# Patient Record
Sex: Male | Born: 1945
Health system: Southern US, Community
[De-identification: ages and names within clinical notes are randomized; demographics above are authoritative.]

## PROBLEM LIST (undated history)

## (undated) DIAGNOSIS — R972 Elevated prostate specific antigen [PSA]: Secondary | ICD-10-CM

## (undated) DIAGNOSIS — M1990 Unspecified osteoarthritis, unspecified site: Secondary | ICD-10-CM

## (undated) DIAGNOSIS — R351 Nocturia: Secondary | ICD-10-CM

## (undated) DIAGNOSIS — Z973 Presence of spectacles and contact lenses: Secondary | ICD-10-CM

## (undated) DIAGNOSIS — C61 Malignant neoplasm of prostate: Secondary | ICD-10-CM

## (undated) DIAGNOSIS — N400 Enlarged prostate without lower urinary tract symptoms: Secondary | ICD-10-CM

## (undated) DIAGNOSIS — I10 Essential (primary) hypertension: Secondary | ICD-10-CM

## (undated) DIAGNOSIS — H409 Unspecified glaucoma: Secondary | ICD-10-CM

## (undated) DIAGNOSIS — R3912 Poor urinary stream: Secondary | ICD-10-CM

## (undated) DIAGNOSIS — E785 Hyperlipidemia, unspecified: Secondary | ICD-10-CM

## (undated) HISTORY — PX: CATARACT EXTRACTION: SUR2

## (undated) HISTORY — PX: BACK SURGERY: SHX140

## (undated) HISTORY — PX: PROSTATE BIOPSY: SHX241

---

## 2001-10-25 ENCOUNTER — Ambulatory Visit (HOSPITAL_COMMUNITY): Admission: RE | Admit: 2001-10-25 | Discharge: 2001-10-25 | Payer: Self-pay | Admitting: Family Medicine

## 2001-10-25 ENCOUNTER — Encounter: Payer: Self-pay | Admitting: Family Medicine

## 2001-11-05 ENCOUNTER — Inpatient Hospital Stay (HOSPITAL_COMMUNITY): Admission: RE | Admit: 2001-11-05 | Discharge: 2001-11-12 | Payer: Self-pay | Admitting: Neurosurgery

## 2001-11-05 ENCOUNTER — Encounter: Payer: Self-pay | Admitting: Neurosurgery

## 2001-11-08 ENCOUNTER — Encounter: Payer: Self-pay | Admitting: Neurosurgery

## 2001-11-10 ENCOUNTER — Encounter: Payer: Self-pay | Admitting: Neurosurgery

## 2001-11-12 ENCOUNTER — Inpatient Hospital Stay (HOSPITAL_COMMUNITY)
Admission: RE | Admit: 2001-11-12 | Discharge: 2001-11-18 | Payer: Self-pay | Admitting: Physical Medicine & Rehabilitation

## 2001-11-22 ENCOUNTER — Encounter
Admission: RE | Admit: 2001-11-22 | Discharge: 2002-01-13 | Payer: Self-pay | Admitting: Physical Medicine & Rehabilitation

## 2002-02-21 ENCOUNTER — Encounter: Payer: Self-pay | Admitting: General Surgery

## 2002-02-21 ENCOUNTER — Encounter: Admission: RE | Admit: 2002-02-21 | Discharge: 2002-02-21 | Payer: Self-pay | Admitting: General Surgery

## 2002-02-22 ENCOUNTER — Ambulatory Visit (HOSPITAL_BASED_OUTPATIENT_CLINIC_OR_DEPARTMENT_OTHER): Admission: RE | Admit: 2002-02-22 | Discharge: 2002-02-22 | Payer: Self-pay | Admitting: General Surgery

## 2002-03-29 ENCOUNTER — Encounter: Admission: RE | Admit: 2002-03-29 | Discharge: 2002-04-07 | Payer: Self-pay | Admitting: Neurosurgery

## 2002-10-10 HISTORY — PX: BACK SURGERY: SHX140

## 2003-08-24 ENCOUNTER — Ambulatory Visit (HOSPITAL_COMMUNITY): Admission: RE | Admit: 2003-08-24 | Discharge: 2003-08-24 | Payer: Self-pay | Admitting: Family Medicine

## 2003-08-24 ENCOUNTER — Encounter: Payer: Self-pay | Admitting: Family Medicine

## 2004-11-24 ENCOUNTER — Emergency Department (HOSPITAL_COMMUNITY): Admission: EM | Admit: 2004-11-24 | Discharge: 2004-11-24 | Payer: Self-pay | Admitting: Emergency Medicine

## 2005-01-18 ENCOUNTER — Emergency Department (HOSPITAL_COMMUNITY): Admission: EM | Admit: 2005-01-18 | Discharge: 2005-01-18 | Payer: Self-pay | Admitting: Emergency Medicine

## 2009-04-29 ENCOUNTER — Emergency Department (HOSPITAL_COMMUNITY): Admission: EM | Admit: 2009-04-29 | Discharge: 2009-04-29 | Payer: Self-pay | Admitting: Emergency Medicine

## 2010-05-21 ENCOUNTER — Ambulatory Visit (HOSPITAL_COMMUNITY): Admission: RE | Admit: 2010-05-21 | Discharge: 2010-05-21 | Payer: Self-pay | Admitting: Family Medicine

## 2011-03-28 NOTE — Discharge Summary (Signed)
[place]. [facility]  Patient:    RISHABH, RINKENBERGER Visit Number: [id number] MRN: [id number]          Service Type: [facility] Location: [id number] Attending Physician:  Herold Harms Dictated by:   Tanya Nones. Jeral Fruit, M.D. Admit Date:  11/12/2001 Disc. Date: 11/11/01                             Discharge Summary  ADMISSION DIAGNOSES: 1. Left L3-4 herniated disc with atrophy of the quadriceps. 2. Diabetes mellitus.  POSTOPERATIVE DIAGNOSES: 1. Left L3-4 herniated disc with atrophy of the quadriceps. 2. Diabetes mellitus. 3. Pneumonia.  HISTORY OF PRESENT ILLNESS:  The patient was seen by me in my office because of back and left leg pain.  Patient had been unable to walk for four weeks. When I saw him in my office, he had a large herniated disc at the level of L3-4 with compromise of the L3 nerve.  He also was noted to have lumbar stenosis.  He has history of diabetes mellitus.  PHYSICAL EXAMINATION:  EXTREMITIES:  2/5 weakness of the left iliac and left quadriceps.  There was mild weakness and pain.  LABORATORY AND ACCESSORY DATA:  At the present time, he has Pseudomonas in the urine.  White blood cells are 6.5 with hematocrit of 32.5 and hemoglobin of 11.4.  Glucose levels between 208 and 147.  Chest x-ray showed the possibility of fracture of the pubis.  However, this was completely ruled out.  Chest x-ray showed pneumonia in the left side.  HOSPITAL COURSE:  The patient was taken to surgery.  Left L3-4 diskectomy with removal of multiple fragments was accomplished.  After surgery, the patient was kept flat in bed.  He had developed fever.  He was seen by Dr. Sharon Seller.  Treatment was started for pneumonia.  The patient has been afebrile for the past three days.  He has no headache.  White blood cells are back to normal.  Lungs sound more clear.  Due to his weakness, decision was made to transfer him to rehabilitation center.  DISCHARGE  CONDITION:  Improved from the point of view he has no pain.  He continues to have weakness of the left leg.  DISCHARGE INSTRUCTIONS:  Continue ADA diet.  Activity as per rehabilitation.  DISCHARGE MEDICATIONS:  He will be taking the same medications while here. Dictated by:   Tanya Nones. Jeral Fruit, M.D. Attending Physician:  Herold Harms DD:  11/12/01 TD:  11/12/01 Job: [id number]

## 2011-03-28 NOTE — Op Note (Signed)
[place]. [facility]  Patient:    Alex Sanchez, Alex Sanchez Visit Number: [id number] MRN: [id number]          Service Type: DSU Location: [facility] Attending Physician:  Arlis Porta Dictated by:   Adolph Pollack, M.D. Proc. Date: 02/22/02 Admit Date:  02/22/2002   CC:         Tanya Nones. Jeral Fruit, M.D.  Butch Penny, M.D.   Operative Report  PREOPERATIVE DIAGNOSIS:  Bilateral inguinal hernias.  POSTOPERATIVE DIAGNOSIS:  Bilateral direct inguinal hernias.  OPERATION PERFORMED:  Laparoscopic repair of bilateral direct inguinal hernias with mesh.  SURGEON:  Adolph Pollack, M.D.  ANESTHESIA:  General.  INDICATIONS FOR PROCEDURE:  This 65 year old male has been noticing an enlarging bulge in the groin that is consistent with a hernia.  He also has a small hernia in the right inguinal area.  He now presents for elective repair. The procedure and the risks were explained to him.  DESCRIPTION OF PROCEDURE:  The patient was brought to the operating room and placed supine on the operating table and general anesthetic was administered. The lower abdomen and groin area were shaved.  A Foley catheter was placed in the bladder.  The lower abdomen and groin were then sterilely prepped and draped.  Local anesthetic was infiltrated in the subumbilical region and a transverse incision was made and carried down to the fascia.  The left anterior rectus sheath was identified and a small incision made in it.  The left rectus muscle was swept laterally exposing the posterior rectus sheath. A plane was created on the posterior rectus sheath into the extraperitoneal space.  A balloon dissection device was passed into the extraperitoneal space and under direct vision, balloon dissection performed of the extraperitoneal space and lower abdomen.  The balloon dissection device was then removed and a trocar was inserted into the extraperitoneal space and CO2 gas  insufflated creating a working space.  Next, the laparoscope was introduced.  Under direct vision two 5 mm trocars were placed through small incisions in the lower midline.  I began blunt dissection to identify the pubic tubercle and Coopers ligament bilaterally.  On the left side I noticed a direct hernia with extraperitoneal fat in it and I reduced this leaving the sac behind but reducing all the extraperitoneal fat back into the extraperitoneal space.  The inferior epigastric vessel was identified and then the spermatic cord contents were dissected free and isolated.  The peritoneum was stripped down on the cord to the level of the umbilicus.  I then dissected free the lateral anterior abdominal wall to the level of the umbilicus.  Next, the right side was approached.  Again, a direct hernia was reduced. This was smaller than on the left side.  I then identified the spermatic cord, isolated this.  A small tear was made in the peritoneum and this was closed with clips.  I stripped the peritoneum off the cord back to the level of the umbilicus then dissected free the anterior and lateral abdominal walls.  Next, a piece of 4 x 6 inch mesh with a slit cut in it longitudinally was passed into the left extraperitoneal space.  It was then anchored to the Coopers ligament with a tacking device.  The slit was placed such that the tails were wrapped around the cord creating a new internal ring.  I then anchored it laterally and anteriorly with the spiral tacker. This covered the direct space, indirect space, and femoral  spaces adequately and more than adequately covered the direct hernia.  Next, a piece of similar sized mesh cut in a similar fashion was placed in the right extraperitoneal space and anchored to Coopers ligament with the spiral tacker.  Once again I wrapped the tails of the mesh around the cord.  I then anchored the anterior and lateral aspects or the cord to the  anterior abdominal and lateral abdominal wall musculature with spiral tacks.  This also covered the direct space and the direct hernia defect adequately as well as covering the indirect and femoral spaces.  Next, I noted that hemostasis was adequate.  I held down the inferolateral aspects of both pieces of mesh.  I then released the CO2 gas.  I removed all the trocars and instruments.  I then closed the anterior rectus sheath fascial defect with interrupted 0 Vicryl sutures.  The skin incisions were closed with 4-0 Monocryl subcuticular stitches.  Steri-Strips and sterile dressings were applied.  The patient tolerated the procedure well without any apparent complications and was taken to recovery in satisfactory condition.  Discharge instruction sheet and pain medicine will be given to him. Dictated by:   Adolph Pollack, M.D. Attending Physician:  Arlis Porta DD:  02/22/02 TD:  02/22/02 Job: [id number]

## 2011-03-28 NOTE — H&P (Signed)
[place]. [facility]  Patient:    Alex Sanchez, Alex Sanchez Visit Number: [id number] MRN: [id number]          Service Type: SUR Location: 3000 [id number] Attending Physician:  Danella Penton Dictated by:   Tanya Nones. Jeral Fruit, M.D. Admit Date:  11/05/2001                           History and Physical  HISTORY OF PRESENT ILLNESS:  Mr. Eckrich is a gentleman who for several months has been complaining of back and left leg pain going to the knee but not below the knee.  The pain is getting worse, up to the point at the end of the month of December he only was able to walk one day.  He denies any problems with his right leg.  He denies any problems with ______ numbness which involved the anterior and medial aspect of the left thigh.  The patient had an MRI ______.  PAST MEDICAL HISTORY: 1. Blind many years ago.  He does not remember what type of surgery. 2. History of left inguinal hernia.  ALLERGIES:  He is not allergic to any medications.  SOCIAL HISTORY:  Negative.  FAMILY HISTORY:  Mother, 4, in good health.  Father, 62, with lung disease.  REVIEW OF SYSTEMS:  Positive for back pain, left leg weakness, high cholesterol, diarrhea, and difficulty urinating.  MEDICATIONS:  Glucophage, as well as an anti-inflammatory.  PHYSICAL EXAMINATION:  GENERAL:  The patient ______ .  He has quite a bit of difficulty sitting and standing.  HEENT:  Normal.  NECK:  Normal.  LUNGS:  Clear.  HEART:  Heart sounds normal.  No murmurs.  EXTREMITIES:  Normal pulses.  ABDOMEN:  There is a left inguinal hernia.  NEUROLOGIC:  Mental status normal.  Cranial nerves normal.  ______ right leg is normal.  Left leg, I can ______ easily the left iliopsoas and the left quadriceps is 2/5.  There is hypotonia of the quadriceps muscle.  Distally he is normal.  Sensation:  He complains of numbness in the anterior and medial aspect of the left thigh.  Reflexes symmetrical, with  absence of the left ______.  ______ maneuvers highly positive on the left side and negative on the right side.  LABORATORY DATA:  The MRI showed that, indeed, this gentleman has a large herniated disk at the level 3-4, with a fragment going to the ______.  CLINICAL IMPRESSION: 1. L3-L4 herniated disk. 2. Borderline lumber stenosis. 3. Inguinal hernia.  RECOMMENDATIONS:  I talked to him and his wife at length yesterday.  They want to go ahead with surgery.  The surgery was fully explained, as well as the risks, such as infection, damage to the vessel, need for further surgery if recurrence, CSF leak, and no improvement whatsoever. Dictated by:   Tanya Nones. Jeral Fruit, M.D. Attending Physician:  Danella Penton DD:  11/05/01 TD:  11/05/01 Job: [id number]

## 2011-03-28 NOTE — Op Note (Signed)
[place]. [facility]  Patient:    Alex Sanchez, Alex Sanchez Visit Number: [id number] MRN: [id number]          Service Type: Attending:  Tanya Nones. Jeral Fruit, M.D. Dictated by:   Tanya Nones. Jeral Fruit, M.D. Proc. Date: 11/05/01                             Operative Report  PREOPERATIVE DIAGNOSES: 1. Left L3-4 herniated disk with multiple fragments compromising the L3 nerve    root. 2. L5 weakness of the left iliopsoas and left quadriceps. 3. Diabetes.  POSTOPERATIVE DIAGNOSES: 1. Left L3-4 herniated disk with multiple fragments compromising the L3 nerve    root. 2. L5 weakness of the left iliopsoas and left quadriceps. 3. Diabetes.  PROCEDURE: 1. Left L3-4 diskectomy, removal of 23-25 free fragments compromising the L3    nerve root. 2. Total gross diskectomy. 3. Microscope.  SURGEON:  Tanya Nones. Jeral Fruit, M.D.  ASSISTANT:  Hewitt Shorts, M.D.  CLINICAL HISTORY:  [name] is a 65 year old gentleman complaining of back and left leg pain for many weeks.  When I saw him yesterday in my office, he had the iliopsoas and the quadriceps at 2/5.  He has absence of the left quadriceps reflex.  Femoral-tracheal maneuver was highly positive.  MRI showed a large herniated disk with fragment going into both L3, compromising the L3 nerve root.  Surgery was advised, and the risks were explained in the history and physical.  DESCRIPTION OF PROCEDURE:  The patient was taken to the OR, and he was positioned in a prone manner.  X-ray was taken which showed that we were at the level of the spinous process of L4.  Then a midline incision from L3 to L4 was made and muscle was retracted laterally.  Nevertheless, to be 100% sure, a repeat x-ray showed that indeed we were at the level of 3-4.  Then we brought the microscope into the area, and with the drill we drilled the lower lamina of L3 and the upper of L5.  Part of the medial facet was removed.  We found that he has a  really quite calcified yellow ligament, which was also excised using the Kerrison punch.  Finally we were able to see the thecal sac, and traction was done medially until we were able to find the L4.  The L4 nerve root was clean.  Immediately what we found, there were several multiple fragments going from the disk of 3-4, going up into the body of L3 and down into the foramen of the L3 nerve root.  Using the pituitary rongeur, between 23 and 25 multiple fragments were removed.  Some of them were right at the level of the axilla of the nerve root.  Then there was an opening in the disk space.  A total gross diskectomy was done.  Having done this, we investigated that there was any pouch of arachnoid at the level of the axilla of L3.  Two single stitches were applied.  Valsalva maneuver was negative.  Nevertheless, Tisseel was applied to the epidural space.  The area was irrigated.  Fat was left in the epidural space, and the wound was closed with Vicryl and nylon. Dictated by:   Tanya Nones. Jeral Fruit, M.D. Attending:  Tanya Nones. Jeral Fruit, M.D. DD:  11/05/01 TD:  11/06/01 Job: [id number]

## 2011-03-28 NOTE — Discharge Summary (Signed)
[place]. [facility]  Patient:    Alex Sanchez, Alex Sanchez Visit Number: [id number] MRN: [id number]          Service Type: [facility] Location: [id number] Attending Physician:  Herold Harms Dictated by:   Junie Bame, P.A. Admit Date:  11/12/2001 Discharge Date: 11/18/2001   CC:         Tanya Nones. Jeral Fruit, M.D.  Dr. Zenovia Jordan   Discharge Summary  DISCHARGE DIAGNOSES: 1. Status post lumbar discectomy secondary to L3-L4 herniated nucleus    pulposus/stenosis. 2. Diabetes mellitus. 3. Urinary tract infection.  HISTORY OF PRESENT ILLNESS:  Alex Sanchez is a 65 year old African American male with a past medical history of diabetes mellitus admitted on November 05, 2001, for lumbar discectomy secondary to L3-L4 herniated nucleus pulposus/stenosis by Tanya Nones. Jeral Fruit, M.D.  Postop complications include hypoxia, urinary tract infection and left-side weakness.  The patient is on subcutaneous heparin for DVT prophylaxis.  PT report at this time reports the patient is ambulating 190 feet with rolling walker and minimum assist for transfers sit to stand.  Hospital course is also significant for urinary tract infection, fevers and severe constipation.  Also noted the patient had a fall approximately two days ago (Wednesday) and is experiencing left-sided lower extremity weakness.  The patient was transferred to rehab on November 12, 2001.  PAST MEDICAL HISTORY:  Significant for diabetes mellitus.  PAST SURGICAL HISTORY:  None.  MEDICATIONS PRIOR TO ADMISSION: 1. Glucotrol 10 mg q.d. 2. Elavil 25 mg q.h.s. 3. Indocin 50 mg t.i.d.  ALLERGIES:  No known drug allergies.  PRIMARY CARE PHYSICIAN:  Dr. Zenovia Jordan.  SOCIAL HISTORY:  The patient lives in a one level home [place] independent prior to admission.  No alcohol or tobacco use. Employed at [employer].  Home has seven steps to entry. Wife is able to assist.  FAMILY HISTORY:   Noncontributory.  REVIEW OF SYSTEMS:  Denies any shortness of breath, chest pain and nausea and vomiting.  HOSPITAL COURSE:  Mr. [name] was admitted to [facility] on November 12, 2001, for comprehensive inpatient rehabilitation where he received more than three hours of PT and OT daily. His hospital course is significant for intermittent  and left leg numbness, urinary tract infection, and increased CBGs. The patient remained on subcutaneous heparin throughout his entire stay in rehab for DVT prophylaxis. Due to increase in CBGs, the patients glucotrol was increased from 5 mg p.o. q.d. to 3 mg p.o. b.i.d.  Glucophage also was added on November 15, 2001, at 500 mg p.o. q.d.  CBGs began to improve with the addition of these medications. At the time of admission the patient completed his course of Cipro 250 mg p.o. b.i.d. for urinary tract infection.  The patient remained asymptomatic while in rehab.  The patient complains of intermittent lower extremity numbness and pain.  He received Vicodin p.r.n. with relief.  The patient has also been followed by Dr. Jeral Fruit while in rehab.  Besides occasional constipation, there were no other major medical complications issues that occurred while in rehab. He received laxatives as needed.  Latest labs indicated that his white blood cell count was 3.9, hemoglobin 12.9, hematocrit 33.7, platelet count 338.  Sodium 136, potassium 4.3, glucose 153, BUN 12, creatinine 3.9.  AST 33, ALT 57, alkaline phosphatase 87.  Latest urine culture performed on November 13, 2001, came back Staphylococcal species of 20,000.  At the time of discharge,  stitches were removed.  Surgical incision demonstrated no signs of infection.  Surgical incision was well-healed.  The patient had minor decreased sensation in his left lower extremity.  The patient was discharged home with his family.  PT report at that time indicated the patient was  ambulating approximately 150 feet with rolling walker, modified independent.  He could perform all ADLs modified independently.  The patient was transferred home with his family.  Vital signs were stable at the time of transfer.  CBGs were running between 92, 167, 153, and 159 at the time of discharge.  DISCHARGE MEDICATIONS: 1. Elavil 25 mg p.o. q.h.s. 2. Glucotrol XL 10 mg twice daily. 3. Glucophage 500 mg daily. 4. Oxycodone 5 to 10 mg every four to six hours as needed for pain. 5. Tylenol 325 mg to 650 mg every four to six hours as needed for pain. 6. Multivitamins daily.  DISCHARGE INSTRUCTIONS:  The patient is to use walker, okay to shower, take back precautions.  No drinking.  No driving until follow-up with Dr. Jeral Fruit.  DIET RESTRICTIONS:  No concentrated sweets, low carbohydrates.  He is to check his sugars at least daily and write down results.  FOLLOW-UP: He will have outpatient therapy at [facility], [place], starting on November 22, 2001, at 9:30 and he was to follow up with Reuel Boom L. Thomasena Edis, M.D., as needed and Dr. Jeral Fruit in February.  Follow-up with Dr. Zenovia Jordan within four to six weeks. Dictated by:   Junie Bame, P.A. Attending Physician:  Herold Harms DD:  11/18/01 TD:  11/19/01 Job: [id number]

## 2011-08-12 ENCOUNTER — Emergency Department (HOSPITAL_COMMUNITY)
Admission: EM | Admit: 2011-08-12 | Discharge: 2011-08-12 | Disposition: A | Discharge status: Home / Self Care | Payer: Self-pay | Attending: Emergency Medicine | Admitting: Emergency Medicine

## 2011-08-12 DIAGNOSIS — E1169 Type 2 diabetes mellitus with other specified complication: Secondary | ICD-10-CM | POA: Insufficient documentation

## 2011-08-12 DIAGNOSIS — E7800 Pure hypercholesterolemia, unspecified: Secondary | ICD-10-CM | POA: Insufficient documentation

## 2011-08-12 DIAGNOSIS — Z760 Encounter for issue of repeat prescription: Secondary | ICD-10-CM | POA: Insufficient documentation

## 2011-08-12 LAB — GLUCOSE, CAPILLARY: Glucose-Capillary: 218 mg/dL — ABNORMAL HIGH (ref 70–99)

## 2011-11-17 ENCOUNTER — Emergency Department (HOSPITAL_COMMUNITY): Payer: Self-pay

## 2011-11-17 ENCOUNTER — Encounter: Payer: Self-pay | Admitting: Emergency Medicine

## 2011-11-17 ENCOUNTER — Emergency Department (HOSPITAL_COMMUNITY)
Admission: EM | Admit: 2011-11-17 | Discharge: 2011-11-17 | Disposition: A | Discharge status: Home / Self Care | Payer: Self-pay | Attending: Emergency Medicine | Admitting: Emergency Medicine

## 2011-11-17 DIAGNOSIS — J3489 Other specified disorders of nose and nasal sinuses: Secondary | ICD-10-CM | POA: Insufficient documentation

## 2011-11-17 DIAGNOSIS — R05 Cough: Secondary | ICD-10-CM | POA: Insufficient documentation

## 2011-11-17 DIAGNOSIS — I10 Essential (primary) hypertension: Secondary | ICD-10-CM | POA: Insufficient documentation

## 2011-11-17 DIAGNOSIS — R6889 Other general symptoms and signs: Secondary | ICD-10-CM | POA: Insufficient documentation

## 2011-11-17 DIAGNOSIS — E119 Type 2 diabetes mellitus without complications: Secondary | ICD-10-CM | POA: Insufficient documentation

## 2011-11-17 DIAGNOSIS — Z79899 Other long term (current) drug therapy: Secondary | ICD-10-CM | POA: Insufficient documentation

## 2011-11-17 DIAGNOSIS — R059 Cough, unspecified: Secondary | ICD-10-CM | POA: Insufficient documentation

## 2011-11-17 DIAGNOSIS — J069 Acute upper respiratory infection, unspecified: Secondary | ICD-10-CM | POA: Insufficient documentation

## 2011-11-17 HISTORY — DX: Essential (primary) hypertension: I10

## 2011-11-17 MED ORDER — HYDROCOD POLST-CHLORPHEN POLST 10-8 MG/5ML PO LQCR: 5.0000 mL | Freq: Two times a day (BID) | ORAL | Status: DC | PRN

## 2011-11-17 MED ORDER — ALBUTEROL SULFATE HFA 108 (90 BASE) MCG/ACT IN AERS
2.0000 | INHALATION_SPRAY | RESPIRATORY_TRACT | Status: DC
  Administered 2011-11-17: 2 via RESPIRATORY_TRACT
  Filled 2011-11-17: qty 6.7

## 2011-11-17 NOTE — ED Notes (Signed)
PT. REPORTS PERSISTENT DRY COUGH FOR 5 DAYS WITH NASAL CONGESTION AND SNEEZING.

## 2011-11-17 NOTE — ED Provider Notes (Signed)
History     CSN: [id number]  Arrival date & time 11/17/11  0620   First MD Initiated Contact with Patient 11/17/11 [phone]      Chief Complaint  Patient presents with  . Cough    (Consider location/radiation/quality/duration/timing/severity/associated sxs/prior treatment) HPI  66 year old gentleman presenting to the ED with chief complaints of cough. Patient states for the past 4-5 days he has been experiencing runny nose, nasal congestion, sneezing, and dry cough.  He denies fever, headache, rash, chest discomfort, nausea, vomiting, diarrhea, abdominal pain, back pain. He denies coughing up blood. He denies any change in his medication. He denies a smoking history. He presents to the ED concerned about the duration of his cough. He has not had his flu shot this year.  Past Medical History  Diagnosis Date  . Hypertension   . Diabetes mellitus     Past Surgical History  Procedure Date  . Back surgery     No family history on file.  History  Substance Use Topics  . Smoking status: Never Smoker   . Smokeless tobacco: Not on file  . Alcohol Use: No      Review of Systems  All other systems reviewed and are negative.    Allergies  Review of patient's allergies indicates no known allergies.  Home Medications   Current Outpatient Rx  Name Route Sig Dispense Refill  . ACETAMINOPHEN 500 MG PO TABS Oral Take 1,000 mg by mouth every 6 (six) hours as needed. For pain/fever     . DEXTROMETHORPHAN-GUAIFENESIN 10-100 MG/5ML PO LIQD Oral Take 15 mLs by mouth every 6 (six) hours as needed. For cough     . GLIPIZIDE ER 5 MG PO TB24 Oral Take 5 mg by mouth daily.      Marland Kitchen METFORMIN HCL 500 MG PO TABS Oral Take 500 mg by mouth 2 (two) times daily with a meal.        BP 130/63  Pulse 70  Temp(Src) 98.3 F (36.8 C) (Oral)  Resp 18  SpO2 98%  Physical Exam  Nursing note and vitals reviewed. Constitutional:       Awake, alert, nontoxic appearance  HENT:  Head: Atraumatic.    Eyes: Right eye exhibits no discharge. Left eye exhibits no discharge.  Neck: Neck supple.  Cardiovascular: Normal rate and regular rhythm.   Pulmonary/Chest: Effort normal. No respiratory distress. He has no wheezes. He has no rales. He exhibits no tenderness.  Abdominal: There is no tenderness. There is no rebound.  Musculoskeletal: He exhibits no tenderness.       Baseline ROM, no obvious new focal weakness  Neurological:       Mental status and motor strength appears baseline for patient and situation  Skin: No rash noted.  Psychiatric: He has a normal mood and affect.    ED Course  Procedures (including critical care time)  Labs Reviewed - No data to display No results found.   No diagnosis found.    MDM  Sxs suggestive of URI.  However, due to age and pt concern, a CXR has been ordered.  Pt currently in NAD, VSS, and afebrile.     7:49 AM CXR normal, reassurance given.  Will discharge with cough medicine and f/u instruction.      Fayrene Helper, [place] 11/17/11 [phone]

## 2011-11-17 NOTE — ED Notes (Signed)
Breath sounds are clear. Pt resting comfortably. Drinks offered to family and provided for patient. Waiting for x-ray results and disposition.

## 2011-11-17 NOTE — ED Provider Notes (Signed)
Medical screening examination/treatment/procedure(s) were performed by non-physician practitioner and as supervising physician I was immediately available for consultation/collaboration.   Lyanne Co, MD 11/17/11 2123

## 2011-11-17 NOTE — ED Notes (Signed)
Pt states that he has had a persistent, dry, non-productive cough for 5 days.  He states that every winter he has the same symptoms, but this year it has not been relieved by robitussin.  Denies hx of asthma, copd, chf.  No LE edema.  Lung sounds clear bil.

## 2013-03-27 ENCOUNTER — Telehealth (HOSPITAL_COMMUNITY): Payer: Self-pay | Admitting: Emergency Medicine

## 2013-03-27 ENCOUNTER — Encounter (HOSPITAL_COMMUNITY): Payer: Self-pay

## 2013-03-27 ENCOUNTER — Emergency Department (HOSPITAL_COMMUNITY)
Admission: EM | Admit: 2013-03-27 | Discharge: 2013-03-27 | Disposition: A | Discharge status: Home / Self Care | Payer: Medicare Other | Attending: Emergency Medicine | Admitting: Emergency Medicine

## 2013-03-27 ENCOUNTER — Emergency Department (HOSPITAL_COMMUNITY): Payer: Medicare Other

## 2013-03-27 DIAGNOSIS — R6889 Other general symptoms and signs: Secondary | ICD-10-CM | POA: Insufficient documentation

## 2013-03-27 DIAGNOSIS — R05 Cough: Secondary | ICD-10-CM | POA: Insufficient documentation

## 2013-03-27 DIAGNOSIS — Z79899 Other long term (current) drug therapy: Secondary | ICD-10-CM | POA: Insufficient documentation

## 2013-03-27 DIAGNOSIS — I10 Essential (primary) hypertension: Secondary | ICD-10-CM | POA: Insufficient documentation

## 2013-03-27 DIAGNOSIS — J069 Acute upper respiratory infection, unspecified: Secondary | ICD-10-CM | POA: Insufficient documentation

## 2013-03-27 DIAGNOSIS — E119 Type 2 diabetes mellitus without complications: Secondary | ICD-10-CM | POA: Insufficient documentation

## 2013-03-27 DIAGNOSIS — R059 Cough, unspecified: Secondary | ICD-10-CM | POA: Insufficient documentation

## 2013-03-27 MED ORDER — MOMETASONE FUROATE 50 MCG/ACT NA SUSP: 2.0000 | Freq: Every day | NASAL | Status: DC

## 2013-03-27 MED ORDER — HYDROCODONE-HOMATROPINE 5-1.5 MG/5ML PO SYRP: 5.0000 mL | ORAL_SOLUTION | Freq: Four times a day (QID) | ORAL | Status: DC | PRN

## 2013-03-27 NOTE — ED Notes (Signed)
Patient discharged with instructions and using teach back method and he verbalizes an understanding

## 2013-03-27 NOTE — ED Provider Notes (Signed)
History     CSN: [id number]  Arrival date & time 03/27/13  7829   First MD Initiated Contact with Patient 03/27/13 [phone]      Chief Complaint  Patient presents with  . Nasal Congestion    (Consider location/radiation/quality/duration/timing/severity/associated sxs/prior treatment) HPI  Patient presents with concerns of cough, congestion, sneezing. Symptoms began approximately 5 days ago. Since onset symptoms have been persistent, with no relief with OTC medication. The patient specifically denies fever, chills, vomiting, diarrhea, chest pain, belly pain. No clear exacerbating factors.   Past Medical History  Diagnosis Date  . Hypertension   . Diabetes mellitus     Past Surgical History  Procedure Laterality Date  . Back surgery      No family history on file.  History  Substance Use Topics  . Smoking status: Never Smoker   . Smokeless tobacco: Not on file  . Alcohol Use: No      Review of Systems  Constitutional:       Per HPI, otherwise negative  HENT:       Per HPI, otherwise negative  Respiratory:       Per HPI, otherwise negative  Cardiovascular:       Per HPI, otherwise negative  Gastrointestinal: Negative for vomiting.  Endocrine:       Negative aside from HPI  Genitourinary:       Neg aside from HPI   Musculoskeletal:       Per HPI, otherwise negative  Skin: Negative.   Neurological: Negative for syncope.    Allergies  Review of patient's allergies indicates no known allergies.  Home Medications   Current Outpatient Rx  Name  Route  Sig  Dispense  Refill  . chlorpheniramine-HYDROcodone (TUSSIONEX PENNKINETIC ER) 10-8 MG/5ML LQCR   Oral   Take 5 mLs by mouth every 12 (twelve) hours as needed.   115 mL   0   . glipiZIDE (GLUCOTROL XL) 5 MG 24 hr tablet   Oral   Take 5 mg by mouth daily.           . metFORMIN (GLUCOPHAGE) 500 MG tablet   Oral   Take 500 mg by mouth 2 (two) times daily with a meal.             BP 151/84   Pulse 85  Temp(Src) 97.8 F (36.6 C)  Resp 20  SpO2 97%  Physical Exam  Nursing note and vitals reviewed. Constitutional: He is oriented to person, place, and time. He appears well-developed. No distress.  HENT:  Head: Normocephalic and atraumatic.  Eyes: Conjunctivae and EOM are normal.  Cardiovascular: Normal rate and regular rhythm.   Pulmonary/Chest: Effort normal. No stridor. No respiratory distress.  Abdominal: He exhibits no distension. There is no tenderness.  Musculoskeletal: He exhibits no edema.  Neurological: He is alert and oriented to person, place, and time.  Skin: Skin is warm and dry.  Psychiatric: He has a normal mood and affect.    ED Course  Procedures (including critical care time)  Labs Reviewed  GLUCOSE, CAPILLARY - Abnormal; Notable for the following:    Glucose-Capillary 238 (*)    All other components within normal limits   No results found.   No diagnosis found.   Pulse ox 99% room air normal  MDM  This patient presents with days of cough, congestion.  On exam is awake alert, not hypoxic, tachypneic or febrile. Patient's x-ray does not demonstrate pneumonia, and absent any other significant complaints,  the patient was discharged with new medication for cough, congestion. Absent any distress, significant risk factors, there is low suspicion for occult ACS or other significant acute pathology.  Gerhard Munch, MD 03/27/13 [phone]

## 2013-03-27 NOTE — ED Notes (Signed)
Pt. Having nasal congestion, sneezing, coughing for 4-5 days.  Denies any fevers

## 2013-03-27 NOTE — ED Notes (Signed)
Pharmacy call Nasonex too expensive can it be changed to Flonase?  Dr Jeraldine Loots consulted OK to switch to [facility].  Pharmacy informed.

## 2013-05-25 ENCOUNTER — Ambulatory Visit (HOSPITAL_COMMUNITY)
Admission: RE | Admit: 2013-05-25 | Discharge: 2013-05-25 | Disposition: A | Discharge status: Home / Self Care | Payer: Medicare Other | Source: Ambulatory Visit | Attending: Family Medicine | Admitting: Family Medicine

## 2013-05-25 ENCOUNTER — Other Ambulatory Visit (HOSPITAL_COMMUNITY): Payer: Self-pay | Admitting: Family Medicine

## 2013-05-25 DIAGNOSIS — M25512 Pain in left shoulder: Secondary | ICD-10-CM

## 2013-05-25 DIAGNOSIS — M25519 Pain in unspecified shoulder: Secondary | ICD-10-CM | POA: Insufficient documentation

## 2013-09-22 ENCOUNTER — Other Ambulatory Visit (HOSPITAL_COMMUNITY): Payer: Self-pay | Admitting: Family Medicine

## 2013-09-22 ENCOUNTER — Other Ambulatory Visit (HOSPITAL_COMMUNITY): Payer: Medicare Other

## 2013-09-22 DIAGNOSIS — R109 Unspecified abdominal pain: Secondary | ICD-10-CM

## 2013-09-22 DIAGNOSIS — R1011 Right upper quadrant pain: Secondary | ICD-10-CM

## 2013-09-23 ENCOUNTER — Ambulatory Visit (HOSPITAL_COMMUNITY): Payer: Medicare Other | Attending: Family Medicine

## 2013-09-29 ENCOUNTER — Encounter (HOSPITAL_COMMUNITY): Payer: Self-pay

## 2013-09-29 ENCOUNTER — Ambulatory Visit (HOSPITAL_COMMUNITY)
Admission: RE | Admit: 2013-09-29 | Discharge: 2013-09-29 | Disposition: A | Discharge status: Home / Self Care | Payer: Medicare Other | Source: Ambulatory Visit | Attending: Family Medicine | Admitting: Family Medicine

## 2013-09-29 DIAGNOSIS — R1031 Right lower quadrant pain: Secondary | ICD-10-CM | POA: Insufficient documentation

## 2013-09-29 DIAGNOSIS — N400 Enlarged prostate without lower urinary tract symptoms: Secondary | ICD-10-CM | POA: Insufficient documentation

## 2013-09-29 DIAGNOSIS — R109 Unspecified abdominal pain: Secondary | ICD-10-CM

## 2013-09-29 DIAGNOSIS — R1011 Right upper quadrant pain: Secondary | ICD-10-CM | POA: Insufficient documentation

## 2013-09-29 MED ORDER — SODIUM CHLORIDE 0.9 % IJ SOLN
INTRAMUSCULAR | Status: AC
  Filled 2013-09-29: qty 500

## 2013-09-29 MED ORDER — IOHEXOL 300 MG/ML  SOLN
100.0000 mL | Freq: Once | INTRAMUSCULAR | Status: AC | PRN
  Administered 2013-09-29: 100 mL via INTRAVENOUS

## 2014-01-12 ENCOUNTER — Emergency Department (HOSPITAL_COMMUNITY)
Admission: EM | Admit: 2014-01-12 | Discharge: 2014-01-12 | Disposition: A | Discharge status: Home / Self Care | Payer: Medicare HMO | Attending: Emergency Medicine | Admitting: Emergency Medicine

## 2014-01-12 ENCOUNTER — Encounter (HOSPITAL_COMMUNITY): Payer: Self-pay | Admitting: Emergency Medicine

## 2014-01-12 ENCOUNTER — Emergency Department (HOSPITAL_COMMUNITY): Payer: Medicare HMO

## 2014-01-12 DIAGNOSIS — K6289 Other specified diseases of anus and rectum: Secondary | ICD-10-CM | POA: Insufficient documentation

## 2014-01-12 DIAGNOSIS — Z79899 Other long term (current) drug therapy: Secondary | ICD-10-CM | POA: Diagnosis not present

## 2014-01-12 DIAGNOSIS — Z7982 Long term (current) use of aspirin: Secondary | ICD-10-CM | POA: Insufficient documentation

## 2014-01-12 DIAGNOSIS — R339 Retention of urine, unspecified: Secondary | ICD-10-CM

## 2014-01-12 DIAGNOSIS — E119 Type 2 diabetes mellitus without complications: Secondary | ICD-10-CM | POA: Diagnosis not present

## 2014-01-12 DIAGNOSIS — R109 Unspecified abdominal pain: Secondary | ICD-10-CM | POA: Insufficient documentation

## 2014-01-12 DIAGNOSIS — K5900 Constipation, unspecified: Secondary | ICD-10-CM | POA: Diagnosis not present

## 2014-01-12 DIAGNOSIS — I10 Essential (primary) hypertension: Secondary | ICD-10-CM | POA: Insufficient documentation

## 2014-01-12 MED ORDER — FLEET ENEMA 7-19 GM/118ML RE ENEM
1.0000 | ENEMA | Freq: Once | RECTAL | Status: AC
  Administered 2014-01-12: 1 via RECTAL
  Filled 2014-01-12: qty 1

## 2014-01-12 MED ORDER — POLYETHYLENE GLYCOL 3350 17 GM/SCOOP PO POWD: 1.0000 | Freq: Once | ORAL | Status: DC

## 2014-01-12 NOTE — ED Notes (Signed)
No relief from enema.

## 2014-01-12 NOTE — Discharge Instructions (Signed)

## 2014-01-12 NOTE — ED Notes (Signed)
Pt states that he was able to urinate some but not relieve himself fully. Still no BM. Reports problems with prostate as well.

## 2014-01-12 NOTE — ED Notes (Signed)
Pt stated that he hadn't been able to urinate in the last 8hrs when offered the bathroom.

## 2014-01-12 NOTE — ED Provider Notes (Signed)
CSN: [id number]     Arrival date & time 01/12/14  1711 History   First MD Initiated Contact with Patient 01/12/14 1957     Chief Complaint  Patient presents with  . Constipation  . Urinary Retention   HPI Comments: 68 yo F hx of DMII, HTN, presents with CC of constipation, urinary retention.  Pt states he has been constipated for the last 3 weeks, with low volume, hard stools, with last BM yesterday.  He states this has worsened over the last few days, and today started to develop urinary retention 10 hours prior to arrival.  He c/o of suprapubic fullness, discomfort, and rectal fullness/pain.  Denies fevers, chills, CP, SOB, nausea, vomiting, rash, myalgias or any other symptoms.  Pt is DMII, HTN.  Has attempted to stay hydrated, but states he recently changed to bottled water, and believes he is getting less hydration as he normally needs.  He attempted taking 3 OTC laxatives No other complaints.    The history is provided by the patient. No language interpreter was used.    Past Medical History  Diagnosis Date  . Hypertension   . Diabetes mellitus    Past Surgical History  Procedure Laterality Date  . Back surgery     History reviewed. No pertinent family history. History  Substance Use Topics  . Smoking status: Never Smoker   . Smokeless tobacco: Not on file  . Alcohol Use: No    Review of Systems  Constitutional: Negative for fever and chills.  Respiratory: Negative for cough and shortness of breath.   Cardiovascular: Negative for chest pain, palpitations and leg swelling.  Gastrointestinal: Positive for abdominal pain, constipation and rectal pain. Negative for nausea, vomiting and diarrhea.  Musculoskeletal: Negative for myalgias.  Skin: Negative for rash.  Neurological: Negative for dizziness, weakness, light-headedness, numbness and headaches.  Hematological: Negative for adenopathy. Does not bruise/bleed easily.  All other systems reviewed and are  negative.      Allergies  Review of patient's allergies indicates no known allergies.  Home Medications   Current Outpatient Rx  Name  Route  Sig  Dispense  Refill  . aspirin 81 MG chewable tablet   Oral   Chew 81 mg by mouth daily.         Marland Kitchen. DM-APAP-CPM (CORICIDIN HBP FLU PO)   Oral   Take 2 tablets by mouth every 8 (eight) hours as needed (for cold symptoms).         Marland Kitchen. glipiZIDE (GLUCOTROL XL) 5 MG 24 hr tablet   Oral   Take 5 mg by mouth daily.           . metFORMIN (GLUCOPHAGE) 500 MG tablet   Oral   Take 500 mg by mouth 2 (two) times daily with a meal.           . Multiple Vitamins-Minerals (MULTIVITAMIN WITH MINERALS) tablet   Oral   Take 1 tablet by mouth daily.         Marland Kitchen. EXPIRED: mometasone (NASONEX) 50 MCG/ACT nasal spray   Nasal   Place 2 sprays into the nose daily.   17 g   0    BP 145/83  Pulse 113  Temp(Src) 97.9 F (36.6 C) (Oral)  Resp 18  Ht 6' (1.829 m)  Wt 170 lb (77.111 kg)  BMI 23.05 kg/m2  SpO2 98% Physical Exam  Vitals reviewed. Constitutional: He is oriented to person, place, and time. He appears well-developed and well-nourished.  HENT:  Head: Normocephalic and atraumatic.  Right Ear: External ear normal.  Left Ear: External ear normal.  Nose: Nose normal.  Mouth/Throat: Oropharynx is clear and moist.  Eyes: Conjunctivae and EOM are normal. Pupils are equal, round, and reactive to light.  Neck: Normal range of motion. Neck supple.  Cardiovascular: Normal rate, regular rhythm, normal heart sounds and intact distal pulses.   Pulmonary/Chest: Effort normal and breath sounds normal. No respiratory distress. He has no wheezes. He has no rales. He exhibits no tenderness.  Abdominal: Bowel sounds are normal. He exhibits no distension and no mass. There is tenderness. There is no rebound and no guarding.  Suprapubic fullness, with TTP.  Rest of abdomen is soft, nondistended, no TTP.    Genitourinary:  Impacted stool in rectal  vault.    Musculoskeletal: Normal range of motion.  Neurological: He is alert and oriented to person, place, and time.  Skin: Skin is warm and dry.    ED Course  Procedures (including critical care time) Labs Review Labs Reviewed - No data to display Imaging Review Dg Abd 1 View  01/12/2014   CLINICAL DATA:  Constipation.  Urinary retention.  EXAM: ABDOMEN - 1 VIEW  COMPARISON:  09/29/2013 abdominal CT  FINDINGS: Moderate volume of formed stool, present on multiple colonic segments. There is a prominent volume of fecal stool, distending the rectum to 9 cm transverse dimension. No evidence of small bowel obstruction.  No abnormal intra-abdominal mass effect, calcification. Low abdominal wall hernia repair. No acute osseous findings.  IMPRESSION: Prominent rectal stool.  No bowel obstruction.   Electronically Signed   By: Tiburcio Pea M.D.   On: 01/12/2014 22:44     EKG Interpretation None      MDM   Final diagnoses:  None   68 yo F hx of DMII, HTN, presents with CC of constipation, urinary retention.  Filed Vitals:   01/12/14 1903  BP: 145/83  Pulse: 113  Temp: 97.9 F (36.6 C)  Resp: 18   Physical exam as above.  Pt slightly tachy, likely 2/2 pain, otherwise VS WNL.  Pt with suprapubic fullness, discomfort, no other abdominal pain, and abdomen is soft, nondistended, nonperitonitic.  Pt with hard stool burden in rectal vault, which was disimpacted.  Pt was unable to defecate or urinate after this.  KUB showed no signs of obstruction, but did show large rectal stool burden.  Fleet enema given.  Shortly after this pt had significant bowel movement, and was finally able to urinate.  He states on reexam that he feels significantly improved.  Pt to be d/c home in good condition.  Encouraged to continue supportive care, high fiber diet, Miralax, and enema if needed.  F/u with PCP in 1 week.  Return precautions given.  Pt understands and agrees with plan.  I have discussed pt's care  plan with Dr. Preston Fleeting.  Jon Gills, MD      Jon Gills, MD 01/12/14 [phone]

## 2014-01-12 NOTE — ED Notes (Signed)
Per pt sts a few weeks of hard stool and now he is having urinary retention. sts some pressure in his bottom.

## 2014-01-12 NOTE — ED Notes (Signed)
MD at bedside- manually disimpacting pt.

## 2014-01-12 NOTE — ED Provider Notes (Signed)
68 year old male has been constipated for the last 3 weeks. Today, he has had difficulty passing urine. On exam, bladder is distended nearly to the umbilicus. He was noted to have a hard impaction on rectal exam. He will be given an enema. He is resisting Foley catheter placement at this time, so we will be observed to see if he is able to urinate after the enema.  I saw and evaluated the patient, reviewed the resident's note and I agree with the findings and plan.   Dione Boozeavid Evona Westra, MD 01/12/14 2111

## 2014-01-30 ENCOUNTER — Encounter (HOSPITAL_COMMUNITY): Payer: Self-pay | Admitting: Emergency Medicine

## 2014-01-30 ENCOUNTER — Emergency Department (HOSPITAL_COMMUNITY): Payer: Medicare HMO

## 2014-01-30 ENCOUNTER — Emergency Department (HOSPITAL_COMMUNITY)
Admission: EM | Admit: 2014-01-30 | Discharge: 2014-01-30 | Disposition: A | Discharge status: Home / Self Care | Payer: Medicare HMO | Attending: Emergency Medicine | Admitting: Emergency Medicine

## 2014-01-30 DIAGNOSIS — Z7982 Long term (current) use of aspirin: Secondary | ICD-10-CM | POA: Insufficient documentation

## 2014-01-30 DIAGNOSIS — I10 Essential (primary) hypertension: Secondary | ICD-10-CM | POA: Insufficient documentation

## 2014-01-30 DIAGNOSIS — Z79899 Other long term (current) drug therapy: Secondary | ICD-10-CM | POA: Insufficient documentation

## 2014-01-30 DIAGNOSIS — M25519 Pain in unspecified shoulder: Secondary | ICD-10-CM | POA: Insufficient documentation

## 2014-01-30 DIAGNOSIS — M779 Enthesopathy, unspecified: Secondary | ICD-10-CM

## 2014-01-30 DIAGNOSIS — E119 Type 2 diabetes mellitus without complications: Secondary | ICD-10-CM | POA: Insufficient documentation

## 2014-01-30 DIAGNOSIS — M948X9 Other specified disorders of cartilage, unspecified sites: Secondary | ICD-10-CM | POA: Insufficient documentation

## 2014-01-30 NOTE — Discharge Instructions (Signed)
Your xray results today reveal a bone spur of the right clavicle (collar bone).  There are no other worrisome findings.  Bone spurs are often caused by arthritis.  Please follow-up with your primary care provider.

## 2014-01-30 NOTE — ED Provider Notes (Signed)
CSN: [id number]     Arrival date & time 01/30/14  1450 History  This chart was scribed for non-physician practitioner Felicie Mornavid Kealy Lewter, NP working with Glynn OctaveStephen Rancour, MD by Valera CastleSteven Perry, ED scribe. This patient was seen in room TR07C/TR07C and the patient's care was started at 4:45 PM.   Chief Complaint  Patient presents with  . Neck Pain   (Consider location/radiation/quality/duration/timing/severity/associated sxs/prior Treatment) The history is provided by the patient. No language interpreter was used.   HPI Comments: Alex Sanchez is a 68 y.o. male who presents to the Emergency Department complaining of a nodal over his left clavicle, that has been gradually increasing, onset 6 months ago. He denies the area being painful to palpate. He denies cough, fever, weight loss, and any other associated symptoms. He reports taking Metformin for his DM, and takes cholesterol medication. He denies problems with his kidneys. He reports h/o pre-HTN.   PCP - Alice ReichertMCINNIS,ANGUS G, MD  Past Medical History  Diagnosis Date  . Hypertension   . Diabetes mellitus    Past Surgical History  Procedure Laterality Date  . Back surgery     No family history on file. History  Substance Use Topics  . Smoking status: Never Smoker   . Smokeless tobacco: Not on file  . Alcohol Use: No    Review of Systems  Constitutional: Negative for fever and unexpected weight change.  Respiratory: Negative for cough.   Musculoskeletal:       Non painful nodal over left clavicle  Skin: Negative for wound.  All other systems reviewed and are negative.   Allergies  Review of patient's allergies indicates no known allergies.  Home Medications   Current Outpatient Rx  Name  Route  Sig  Dispense  Refill  . aspirin 81 MG chewable tablet   Oral   Chew 81 mg by mouth daily.         Marland Kitchen. glipiZIDE (GLUCOTROL XL) 5 MG 24 hr tablet   Oral   Take 5 mg by mouth daily.           . metFORMIN (GLUCOPHAGE) 500 MG tablet    Oral   Take 500-1,000 mg by mouth 2 (two) times daily with a meal. Take 2 tablets in AM and 1 tablet in PM         . Multiple Vitamins-Minerals (MULTIVITAMIN WITH MINERALS) tablet   Oral   Take 1 tablet by mouth daily.         . simvastatin (ZOCOR) 40 MG tablet   Oral   Take 40 mg by mouth daily.         Marland Kitchen. EXPIRED: mometasone (NASONEX) 50 MCG/ACT nasal spray   Nasal   Place 2 sprays into the nose daily.   17 g   0    BP 132/70  Pulse 88  Temp(Src) 97.3 F (36.3 C)  Resp 16  SpO2 97%  Physical Exam  Nursing note and vitals reviewed. Constitutional: He is oriented to person, place, and time. He appears well-developed and well-nourished. No distress.  HENT:  Head: Normocephalic and atraumatic.  Eyes: EOM are normal.  Neck: Neck supple. No tracheal deviation present.  Cardiovascular: Normal rate.   Pulmonary/Chest: Effort normal. No respiratory distress.  Musculoskeletal: Normal range of motion. He exhibits no tenderness.  Nodal over sternal clavicle junction point. No pain, no injury.   Neurological: He is alert and oriented to person, place, and time.  Skin: Skin is warm and dry.  Psychiatric: He  has a normal mood and affect. His behavior is normal.    ED Course  Procedures (including critical care time)  DIAGNOSTIC STUDIES: Oxygen Saturation is 97% on room air, normal by my interpretation.    COORDINATION OF CARE: 4:48 PM-Discussed treatment plan which includes xrays with pt at bedside and pt agreed to plan.   Dg Chest 2 View  01/30/2014   CLINICAL DATA:  Clavicle and rib nodules  EXAM: CHEST  2 VIEW  COMPARISON:  07/28/2013  FINDINGS: Cardiomediastinal silhouette is stable. No acute infiltrate or pulmonary edema. Mild degenerative changes thoracic spine. There are skin markers on the right anterior clavicle and mid anterior ribs. No definite destructive bony lesions noted in the region of the markers. Clinical correlation is necessary. If there is high  clinical suspicious for destructive bony lesion further correlation with CT scan or MRI is recommended.  IMPRESSION: No active disease. No definite destructive bony lesion is noted. If there is high clinical suspicious for bony lesions further correlation with MRI or CT scan is recommended.   Electronically Signed   By: Natasha Mead M.D.   On: 01/30/2014 18:12   Ct Chest Wo Contrast  01/30/2014   CLINICAL DATA:  Enlarging lymph node along the clavicle.  EXAM: CT CHEST WITHOUT CONTRAST  TECHNIQUE: Multidetector CT imaging of the chest was performed following the standard protocol without IV contrast.  COMPARISON:  DG CHEST 2 VIEW dated 01/30/2014  FINDINGS: Technologist note indicates that the problem is over the left clavicle, but the marker indicating the location of the problem is near the right clavicular head. There is spurring of the right clavicular head. Adjacent strap musculature and thyroid gland appear normal. No other significant thoracic inlet abnormality observed. No visualize supraclavicular adenopathy.  No thoracic adenopathy. Prominence of gas and stool in the colon. Contracted gallbladder. Fatty infiltration along the gallbladder fossa. Bifida right third rib with the gracile left fourth rib which indents the left thorax. No associated thoracic vertebral anomalies although there is thoracic spondylosis.  The lungs appear clear.  IMPRESSION: 1. The marked "Nodule" appears to correspond to spurring of the medial right clavicular head. 2. 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 3. Prominence of gas and stool in the upper colon.   Electronically Signed   By: Herbie Baltimore M.D.   On: 01/30/2014 20:08    EKG Interpretation None     Medications - No data to display  Patient discussed with and seen by Dr. Manus Gunning.  Non-contrasted CT of chest obtained.  Results reviewed and  shared with patient. MDM   Final diagnoses:  None    Spurring of clavicular head.  Patient to follow-up with his PCP.  I personally performed the services described in this documentation, which was scribed in my presence. The recorded information has been reviewed and is accurate.    Jimmye Norman, NP 01/31/14 [phone]

## 2014-01-30 NOTE — ED Notes (Signed)
Nodal  on left clavicle that has been there 6 months seems to be getting bigger  States getting uncomfortable no pain mo injury

## 2014-01-30 NOTE — ED Notes (Signed)
Smith, NP at bedside.  

## 2014-01-31 NOTE — ED Provider Notes (Signed)
Medical screening examination/treatment/procedure(s) were conducted as a shared visit with non-physician practitioner(s) and myself.  I personally evaluated the patient during the encounter.  TTP R clavicle at medial head, no overlying skin change.  Lungs clear, heart regular   EKG Interpretation None       Glynn OctaveStephen Elfrida Pixley, MD 01/31/14 1020

## 2015-07-29 IMAGING — CR DG ABDOMEN 1V
1 series · 1 of 1 positions shown · non-contrast
Comparison: 09/29/2013 abdominal CT

CLINICAL DATA: Constipation.  Urinary retention.

EXAM:
ABDOMEN - 1 VIEW

[t abdomen supine]
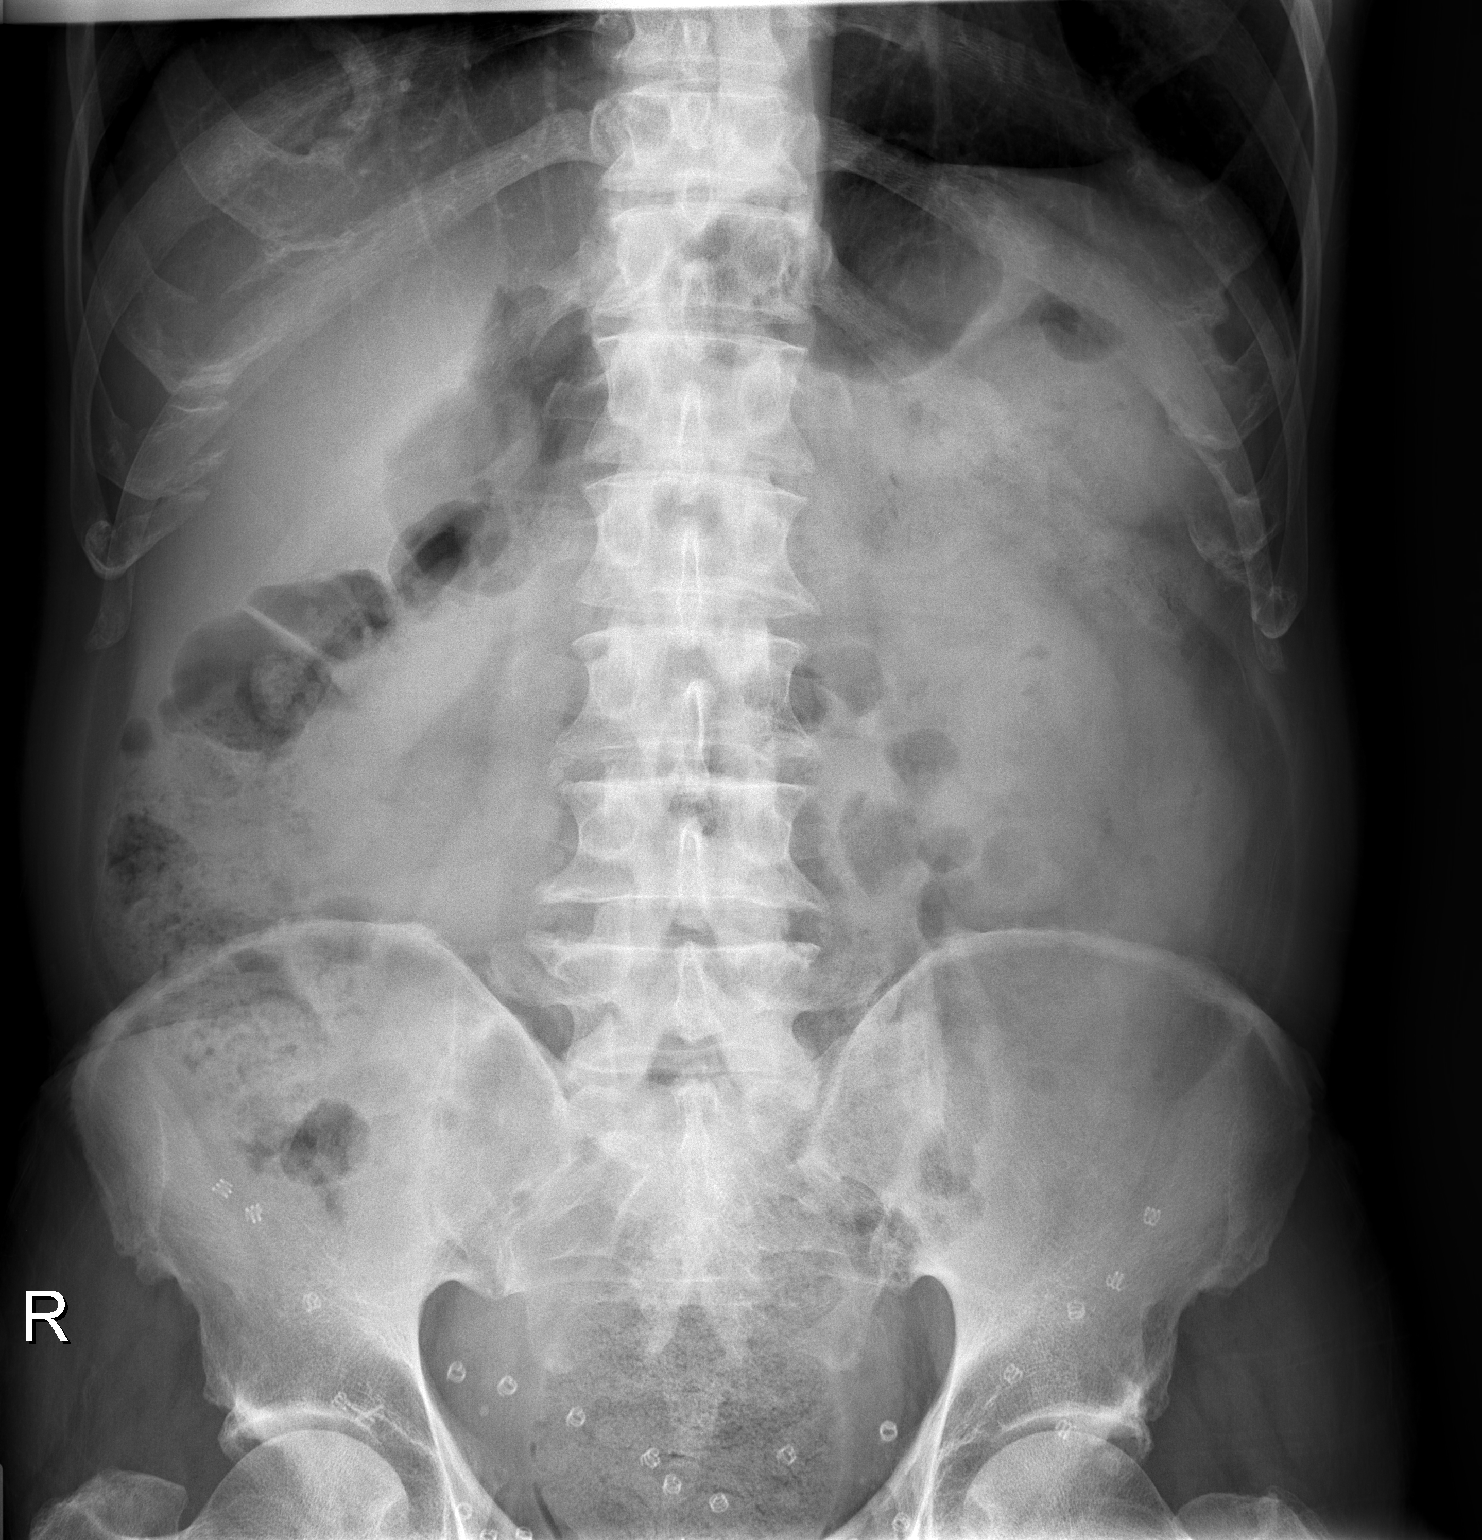

[1 of 1 positions shown; findings below may reference images not displayed]

FINDINGS: Moderate volume of formed stool, present on multiple colonic
segments. There is a prominent volume of fecal stool, distending the
rectum to 9 cm transverse dimension. No evidence of small bowel
obstruction.

No abnormal intra-abdominal mass effect, calcification. Low
abdominal wall hernia repair. No acute osseous findings.
IMPRESSION: Prominent rectal stool.  No bowel obstruction.

## 2015-08-06 ENCOUNTER — Encounter (INDEPENDENT_AMBULATORY_CARE_PROVIDER_SITE_OTHER): Payer: Self-pay | Admitting: *Deleted

## 2017-02-10 DIAGNOSIS — I10 Essential (primary) hypertension: Secondary | ICD-10-CM | POA: Diagnosis not present

## 2017-02-10 DIAGNOSIS — M545 Low back pain: Secondary | ICD-10-CM | POA: Diagnosis not present

## 2017-02-10 DIAGNOSIS — E119 Type 2 diabetes mellitus without complications: Secondary | ICD-10-CM | POA: Diagnosis not present

## 2017-02-10 DIAGNOSIS — Z7982 Long term (current) use of aspirin: Secondary | ICD-10-CM | POA: Diagnosis not present

## 2017-02-10 DIAGNOSIS — E785 Hyperlipidemia, unspecified: Secondary | ICD-10-CM | POA: Diagnosis not present

## 2017-02-10 DIAGNOSIS — Z794 Long term (current) use of insulin: Secondary | ICD-10-CM | POA: Diagnosis not present

## 2017-02-10 DIAGNOSIS — Z6822 Body mass index (BMI) 22.0-22.9, adult: Secondary | ICD-10-CM | POA: Diagnosis not present

## 2017-04-01 DIAGNOSIS — E118 Type 2 diabetes mellitus with unspecified complications: Secondary | ICD-10-CM | POA: Diagnosis not present

## 2017-04-01 DIAGNOSIS — M545 Low back pain: Secondary | ICD-10-CM | POA: Diagnosis not present

## 2017-04-01 DIAGNOSIS — E7800 Pure hypercholesterolemia, unspecified: Secondary | ICD-10-CM | POA: Diagnosis not present

## 2017-04-10 DIAGNOSIS — H401134 Primary open-angle glaucoma, bilateral, indeterminate stage: Secondary | ICD-10-CM | POA: Diagnosis not present

## 2017-04-10 DIAGNOSIS — E119 Type 2 diabetes mellitus without complications: Secondary | ICD-10-CM | POA: Diagnosis not present

## 2017-04-10 DIAGNOSIS — H25813 Combined forms of age-related cataract, bilateral: Secondary | ICD-10-CM | POA: Diagnosis not present

## 2017-04-10 DIAGNOSIS — Z794 Long term (current) use of insulin: Secondary | ICD-10-CM | POA: Diagnosis not present

## 2017-05-27 DIAGNOSIS — E118 Type 2 diabetes mellitus with unspecified complications: Secondary | ICD-10-CM | POA: Diagnosis not present

## 2017-05-27 DIAGNOSIS — E7800 Pure hypercholesterolemia, unspecified: Secondary | ICD-10-CM | POA: Diagnosis not present

## 2017-05-27 DIAGNOSIS — K047 Periapical abscess without sinus: Secondary | ICD-10-CM | POA: Diagnosis not present

## 2017-06-10 DIAGNOSIS — I10 Essential (primary) hypertension: Secondary | ICD-10-CM | POA: Diagnosis not present

## 2017-06-10 DIAGNOSIS — E118 Type 2 diabetes mellitus with unspecified complications: Secondary | ICD-10-CM | POA: Diagnosis not present

## 2017-06-10 DIAGNOSIS — E7800 Pure hypercholesterolemia, unspecified: Secondary | ICD-10-CM | POA: Diagnosis not present

## 2017-07-17 DIAGNOSIS — H401121 Primary open-angle glaucoma, left eye, mild stage: Secondary | ICD-10-CM | POA: Diagnosis not present

## 2017-07-17 DIAGNOSIS — H401112 Primary open-angle glaucoma, right eye, moderate stage: Secondary | ICD-10-CM | POA: Diagnosis not present

## 2017-07-17 DIAGNOSIS — H25813 Combined forms of age-related cataract, bilateral: Secondary | ICD-10-CM | POA: Diagnosis not present

## 2017-07-17 DIAGNOSIS — Z794 Long term (current) use of insulin: Secondary | ICD-10-CM | POA: Diagnosis not present

## 2017-07-29 DIAGNOSIS — E118 Type 2 diabetes mellitus with unspecified complications: Secondary | ICD-10-CM | POA: Diagnosis not present

## 2017-07-29 DIAGNOSIS — I10 Essential (primary) hypertension: Secondary | ICD-10-CM | POA: Diagnosis not present

## 2017-07-31 DIAGNOSIS — E118 Type 2 diabetes mellitus with unspecified complications: Secondary | ICD-10-CM | POA: Diagnosis not present

## 2017-07-31 DIAGNOSIS — I10 Essential (primary) hypertension: Secondary | ICD-10-CM | POA: Diagnosis not present

## 2017-07-31 DIAGNOSIS — E7800 Pure hypercholesterolemia, unspecified: Secondary | ICD-10-CM | POA: Diagnosis not present

## 2017-08-31 DIAGNOSIS — R300 Dysuria: Secondary | ICD-10-CM | POA: Diagnosis not present

## 2017-08-31 DIAGNOSIS — N390 Urinary tract infection, site not specified: Secondary | ICD-10-CM | POA: Diagnosis not present

## 2017-09-04 DIAGNOSIS — H401121 Primary open-angle glaucoma, left eye, mild stage: Secondary | ICD-10-CM | POA: Diagnosis not present

## 2017-09-04 DIAGNOSIS — H401112 Primary open-angle glaucoma, right eye, moderate stage: Secondary | ICD-10-CM | POA: Diagnosis not present

## 2017-09-07 DIAGNOSIS — N390 Urinary tract infection, site not specified: Secondary | ICD-10-CM | POA: Diagnosis not present

## 2017-09-07 DIAGNOSIS — E119 Type 2 diabetes mellitus without complications: Secondary | ICD-10-CM | POA: Diagnosis not present

## 2017-09-24 DIAGNOSIS — H401121 Primary open-angle glaucoma, left eye, mild stage: Secondary | ICD-10-CM | POA: Diagnosis not present

## 2017-09-24 DIAGNOSIS — H25811 Combined forms of age-related cataract, right eye: Secondary | ICD-10-CM | POA: Diagnosis not present

## 2017-09-28 DIAGNOSIS — H1131 Conjunctival hemorrhage, right eye: Secondary | ICD-10-CM | POA: Diagnosis not present

## 2017-10-15 DIAGNOSIS — H25811 Combined forms of age-related cataract, right eye: Secondary | ICD-10-CM | POA: Diagnosis not present

## 2017-10-15 DIAGNOSIS — H401112 Primary open-angle glaucoma, right eye, moderate stage: Secondary | ICD-10-CM | POA: Diagnosis not present

## 2017-10-15 DIAGNOSIS — H401111 Primary open-angle glaucoma, right eye, mild stage: Secondary | ICD-10-CM | POA: Diagnosis not present

## 2018-02-10 ENCOUNTER — Emergency Department (HOSPITAL_COMMUNITY): Payer: Medicare HMO

## 2018-02-10 ENCOUNTER — Other Ambulatory Visit: Payer: Self-pay

## 2018-02-10 ENCOUNTER — Encounter (HOSPITAL_COMMUNITY): Payer: Self-pay

## 2018-02-10 ENCOUNTER — Emergency Department (HOSPITAL_COMMUNITY)
Admission: EM | Admit: 2018-02-10 | Discharge: 2018-02-10 | Disposition: A | Discharge status: Home / Self Care | Payer: Medicare HMO | Attending: Emergency Medicine | Admitting: Emergency Medicine

## 2018-02-10 DIAGNOSIS — Z7984 Long term (current) use of oral hypoglycemic drugs: Secondary | ICD-10-CM | POA: Diagnosis not present

## 2018-02-10 DIAGNOSIS — E1165 Type 2 diabetes mellitus with hyperglycemia: Secondary | ICD-10-CM | POA: Diagnosis not present

## 2018-02-10 DIAGNOSIS — J3489 Other specified disorders of nose and nasal sinuses: Secondary | ICD-10-CM | POA: Insufficient documentation

## 2018-02-10 DIAGNOSIS — R059 Cough, unspecified: Secondary | ICD-10-CM

## 2018-02-10 DIAGNOSIS — R000 Tachycardia, unspecified: Secondary | ICD-10-CM | POA: Insufficient documentation

## 2018-02-10 DIAGNOSIS — R05 Cough: Secondary | ICD-10-CM | POA: Diagnosis not present

## 2018-02-10 DIAGNOSIS — I10 Essential (primary) hypertension: Secondary | ICD-10-CM | POA: Insufficient documentation

## 2018-02-10 DIAGNOSIS — Z79899 Other long term (current) drug therapy: Secondary | ICD-10-CM | POA: Insufficient documentation

## 2018-02-10 DIAGNOSIS — R0981 Nasal congestion: Secondary | ICD-10-CM | POA: Diagnosis not present

## 2018-02-10 DIAGNOSIS — R739 Hyperglycemia, unspecified: Secondary | ICD-10-CM

## 2018-02-10 LAB — URINALYSIS, ROUTINE W REFLEX MICROSCOPIC
Bacteria, UA: NONE SEEN
Bilirubin Urine: NEGATIVE
Glucose, UA: >=500 mg/dL — AB
Hgb urine dipstick: NEGATIVE
Ketones, ur: NEGATIVE mg/dL
Leukocytes, UA: NEGATIVE
Nitrite: NEGATIVE
Protein, ur: NEGATIVE mg/dL
Specific Gravity, Urine: 1.016 (ref 1.005–1.030)
Squamous Epithelial / LPF: NONE SEEN
pH: 5 (ref 5.0–8.0)

## 2018-02-10 LAB — BASIC METABOLIC PANEL
Anion gap: 11 (ref 5–15)
BUN: 10 mg/dL (ref 6–20)
CALCIUM: 8.8 mg/dL — AB (ref 8.9–10.3)
CO2: 26 mmol/L (ref 22–32)
CREATININE: 0.78 mg/dL (ref 0.61–1.24)
Chloride: 95 mmol/L — ABNORMAL LOW (ref 101–111)
Glucose, Bld: 325 mg/dL — ABNORMAL HIGH (ref 65–99)
Potassium: 4 mmol/L (ref 3.5–5.1)
SODIUM: 132 mmol/L — AB (ref 135–145)

## 2018-02-10 LAB — CBG MONITORING, ED
Glucose-Capillary: 314 mg/dL — ABNORMAL HIGH (ref 65–99)
Glucose-Capillary: 233 mg/dL — ABNORMAL HIGH (ref 65–99)

## 2018-02-10 LAB — CBC
HEMATOCRIT: 39.3 % (ref 39.0–52.0)
HEMOGLOBIN: 12.8 g/dL — AB (ref 13.0–17.0)
MCH: 28.4 pg (ref 26.0–34.0)
MCHC: 32.6 g/dL (ref 30.0–36.0)
MCV: 87.1 fL (ref 78.0–100.0)
Platelets: 199 10*3/uL (ref 150–400)
RBC: 4.51 MIL/uL (ref 4.22–5.81)
RDW: 13.3 % (ref 11.5–15.5)
WBC: 3.2 10*3/uL — ABNORMAL LOW (ref 4.0–10.5)

## 2018-02-10 MED ORDER — SODIUM CHLORIDE 0.9 % IV BOLUS
1000.0000 mL | Freq: Once | INTRAVENOUS | Status: AC
  Administered 2018-02-10: 1000 mL via INTRAVENOUS

## 2018-02-10 MED ORDER — BENZONATATE 100 MG PO CAPS: 100.0000 mg | ORAL_CAPSULE | Freq: Three times a day (TID) | ORAL | 0 refills | Status: DC

## 2018-02-10 NOTE — ED Provider Notes (Signed)
[name] EMERGENCY DEPARTMENT Provider Note   CSN: [id number] Arrival date & time: 02/10/18  1106     History   Chief Complaint Chief Complaint  Patient presents with  . Nasal Congestion  . Hyperglycemia    HPI Alex Sanchez is a 72 y.o. male with PMH/o DM, HTN who presents for evaluation of nasal congestion, cough and hyperglycemia. Patient reports that for the last week he has had a cough that he states is productive of phlegm.  He also reports associated nasal congestion, rhinorrhea.  He states he has been taking over-the-counter Coricidin to help with his cough with minimal improvement.  Patient also reports feeling like he is hyperglycemic.  He states that he has had decreased p.o. but denies any nausea/vomiting.  Patient reports that he is currently on metformin, glipizide and Actos for his diabetes.  Patient reports that he has not been taking his Actos since being sick over the last week.  Patient denies any fevers, chest pain, difficulty breathing, abdominal pain, nausea/vomiting, dysuria, hematuria.  The history is provided by the patient.    Past Medical History:  Diagnosis Date  . Diabetes mellitus   . Hypertension     There are no active problems to display for this patient.   Past Surgical History:  Procedure Laterality Date  . BACK SURGERY          Home Medications    Prior to Admission medications   Medication Sig Start Date End Date Taking? Authorizing Provider  aspirin 81 MG chewable tablet Chew 81 mg by mouth daily.   Yes [provider]  atorvastatin (LIPITOR) 20 MG tablet Take 20 mg by mouth daily.   Yes [provider]  glipiZIDE (GLUCOTROL) 10 MG tablet Take 10 mg by mouth 2 (two) times daily before a meal.   Yes [provider]  metFORMIN (GLUCOPHAGE) 500 MG tablet Take 500-1,000 mg by mouth 2 (two) times daily with a meal. Take 2 tablets in AM and 1 tablet in PM   Yes [provider]    Multiple Vitamins-Minerals (MULTIVITAMIN WITH MINERALS) tablet Take 1 tablet by mouth daily.   Yes [provider]  pioglitazone (ACTOS) 30 MG tablet Take 30 mg by mouth daily.   Yes [provider]  simvastatin (ZOCOR) 40 MG tablet Take 40 mg by mouth daily.   Yes [provider]  benzonatate (TESSALON) 100 MG capsule Take 1 capsule (100 mg total) by mouth every 8 (eight) hours. 02/10/18   Maxwell Caul, PA-C  glipiZIDE (GLUCOTROL XL) 5 MG 24 hr tablet Take 5 mg by mouth daily.      [provider]  mometasone (NASONEX) 50 MCG/ACT nasal spray Place 2 sprays into the nose daily. 03/27/13 03/30/13  Gerhard Munch, MD    Family History No family history on file.  Social History Social History   Tobacco Use  . Smoking status: Never Smoker  Substance Use Topics  . Alcohol use: No  . Drug use: No     Allergies   Patient has no known allergies.   Review of Systems Review of Systems  Constitutional: Negative for chills and fever.  HENT: Positive for congestion and rhinorrhea.   Eyes: Negative for visual disturbance.  Respiratory: Positive for cough. Negative for shortness of breath.   Cardiovascular: Negative for chest pain.  Gastrointestinal: Negative for abdominal pain, diarrhea, nausea and vomiting.  Genitourinary: Negative for dysuria and hematuria.  Musculoskeletal: Negative for back pain  and neck pain.  Neurological: Negative for dizziness, weakness, numbness and headaches.  All other systems reviewed and are negative.    Physical Exam Updated Vital Signs BP (!) 143/76 (BP Location: Left Arm)   Pulse 81   Temp 98.5 F (36.9 C) (Oral)   Resp 18   SpO2 97%   Physical Exam  Constitutional: He is oriented to person, place, and time. He appears well-developed and well-nourished.  HENT:  Head: Normocephalic and atraumatic.  Nose: Mucosal edema present.  Mouth/Throat: Oropharynx is clear and moist and mucous membranes are normal.   Eyes: Pupils are equal, round, and reactive to light. Conjunctivae, EOM and lids are normal.  Neck: Full passive range of motion without pain.  Cardiovascular: Normal rate, regular rhythm, normal heart sounds and normal pulses. Exam reveals no gallop and no friction rub.  No murmur heard. Pulmonary/Chest: Effort normal and breath sounds normal.  No evidence of respiratory distress. Able to speak in full sentences without difficulty.  Abdominal: Soft. Normal appearance. There is no tenderness. There is no rigidity and no guarding.  Abdomen is soft, non-distended, non-tender.   Musculoskeletal: Normal range of motion.  Neurological: He is alert and oriented to person, place, and time.  Skin: Skin is warm and dry. Capillary refill takes less than 2 seconds.  Psychiatric: He has a normal mood and affect. His speech is normal.  Nursing note and vitals reviewed.    ED Treatments / Results  Labs (all labs ordered are listed, but only abnormal results are displayed) Labs Reviewed  BASIC METABOLIC PANEL - Abnormal; Notable for the following components:      Result Value   Sodium 132 (*)    Chloride 95 (*)    Glucose, Bld 325 (*)    Calcium 8.8 (*)    All other components within normal limits  CBC - Abnormal; Notable for the following components:   WBC 3.2 (*)    Hemoglobin 12.8 (*)    All other components within normal limits  URINALYSIS, ROUTINE W REFLEX MICROSCOPIC - Abnormal; Notable for the following components:   Glucose, UA >=500 (*)    All other components within normal limits  CBG MONITORING, ED - Abnormal; Notable for the following components:   Glucose-Capillary 314 (*)    All other components within normal limits  CBG MONITORING, ED - Abnormal; Notable for the following components:   Glucose-Capillary 233 (*)    All other components within normal limits    EKG None  Radiology Dg Chest 2 View  Result Date: 02/10/2018 CLINICAL DATA:  One week of cough and chest  congestion. Patient is found to be hyperglycemic. EXAM: CHEST - 2 VIEW COMPARISON:  Chest x-ray and chest CT scan of January 30, 2014 FINDINGS: The lungs are hyperinflated but clear. The heart and pulmonary vascularity are normal. The mediastinum is normal in width. There is no pleural effusion. The mediastinum is normal in width. There is old deformity of the left fourth rib. IMPRESSION: Chronic bronchitic changes. No pneumonia, CHF, nor other acute cardiopulmonary abnormality. Electronically Signed   By: David  [place]az[place]Jordan M.D.   On: 02/10/2018 12:18    Procedures Procedures (including critical care time)  Medications Ordered in ED Medications  sodium chloride 0.9 % bolus 1,000 mL (0 mLs Intravenous Stopped 02/10/18 1435)     Initial Impression / Assessment and Plan / ED Course  I have reviewed the triage vital signs and the nursing notes.  Pertinent labs & imaging results that were  available during my care of the patient were reviewed by me and considered in my medical decision making (see chart for details).     72 y.o. M past medical history of hypertension, diabetes who presents for evaluation of cough, congestion and hyperglycemia.  Reports cough productive of phlegm for 1 week.  Has been taking over-the-counter meds with no improvement.  No fevers, chest pain, difficulty breathing.  Also reports feeling like his blood sugars have been high the last week.  Does report that he has not been taking his Actos as he is usually supposed to.  Does state he has been compliant with his metformin and his glipizide.  No nausea/vomiting.  On initial ED arrival, patient is afebrile, slightly tachycardic, slightly hypertensive.  On physical exam, lungs clear to auscultation bilaterally.  Abdomen soft, nondistended, nontender.  Consider upper respiratory infection versus acute infectious etiology.  History/physical exam is not concerning for DKA.  Plan to check basic labs, chest x-ray.  BMP shows sodium 132,  bicarb is 26, glucose is 325.  BUN and creatinine are unremarkable.  CBC shows leukopenia of 3.2.  Hemoglobin is 12.8.  UA negative for any acute infectious etiology.  Given normal bicarb and anion gap of 11, do not suspect DKA at this time.  I suspect the patient's hyperglycemia is likely due to the fact that he has been noncompliant with Actos.  We will plan to give IVF in the department and reassess.  Repeat CBG's his glucose of 233  After 1 liter of fluids.  Vital signs are stable.  I discussed results with patient.  Instructed him to take his medications as directed.  Instructed in follow-up with his primary care doctor next 24 to 48 hours for further evaluation. Patient had ample opportunity for questions and discussion. All patient's questions were answered with full understanding. Strict return precautions discussed. Patient expresses understanding and agreement to plan.   Final Clinical Impressions(s) / ED Diagnoses   Final diagnoses:  Nasal congestion  Hyperglycemia  Cough    ED Discharge Orders        Ordered    benzonatate (TESSALON) 100 MG capsule  Every 8 hours     02/10/18 1518       Maxwell Caul, PA-C 02/10/18 1600    Pricilla Loveless, MD 02/10/18 1717

## 2018-02-10 NOTE — ED Triage Notes (Signed)
Patient complains of 1 week of cough and congestion-states that he thinks his BS running high. Alert and oriented, NAD

## 2018-02-10 NOTE — Discharge Instructions (Signed)
Take Tessalon Perles as directed for cough.  Make sure you are taking your metformin, glipizide and Actos as directed.  Follow-up with your primary care doctor in the next 24-48 hours.  Return to the emergency department for any worsening cough, fever, difficulty breathing, chest pain, nausea/vomiting, abdominal pain or any other worsening or concerning symptoms.

## 2018-02-10 NOTE — ED Notes (Signed)
Patient transported to X-ray 

## 2018-02-10 NOTE — ED Notes (Signed)
Checked patient blood sugar it was 314 notified Rn of blood sugar patient is now headed to x ray

## 2018-02-10 NOTE — ED Notes (Signed)
Pt discharged from ED; instructions provided and scripts given; Pt encouraged to return to ED if symptoms worsen and to f/u with PCP; Pt verbalized understanding of all instructions 

## 2018-02-23 DIAGNOSIS — E118 Type 2 diabetes mellitus with unspecified complications: Secondary | ICD-10-CM | POA: Diagnosis not present

## 2018-02-23 DIAGNOSIS — E7800 Pure hypercholesterolemia, unspecified: Secondary | ICD-10-CM | POA: Diagnosis not present

## 2018-03-26 DIAGNOSIS — I10 Essential (primary) hypertension: Secondary | ICD-10-CM | POA: Diagnosis not present

## 2018-03-26 DIAGNOSIS — E119 Type 2 diabetes mellitus without complications: Secondary | ICD-10-CM | POA: Diagnosis not present

## 2018-03-30 DIAGNOSIS — E118 Type 2 diabetes mellitus with unspecified complications: Secondary | ICD-10-CM | POA: Diagnosis not present

## 2018-03-30 DIAGNOSIS — M545 Low back pain: Secondary | ICD-10-CM | POA: Diagnosis not present

## 2018-03-30 DIAGNOSIS — E7800 Pure hypercholesterolemia, unspecified: Secondary | ICD-10-CM | POA: Diagnosis not present

## 2018-06-22 DIAGNOSIS — E119 Type 2 diabetes mellitus without complications: Secondary | ICD-10-CM | POA: Diagnosis not present

## 2018-06-22 DIAGNOSIS — E7800 Pure hypercholesterolemia, unspecified: Secondary | ICD-10-CM | POA: Diagnosis not present

## 2018-06-22 DIAGNOSIS — I10 Essential (primary) hypertension: Secondary | ICD-10-CM | POA: Diagnosis not present

## 2018-08-28 DIAGNOSIS — E119 Type 2 diabetes mellitus without complications: Secondary | ICD-10-CM | POA: Diagnosis not present

## 2018-08-28 DIAGNOSIS — I10 Essential (primary) hypertension: Secondary | ICD-10-CM | POA: Diagnosis not present

## 2018-08-31 DIAGNOSIS — E118 Type 2 diabetes mellitus with unspecified complications: Secondary | ICD-10-CM | POA: Diagnosis not present

## 2018-08-31 DIAGNOSIS — Z7984 Long term (current) use of oral hypoglycemic drugs: Secondary | ICD-10-CM | POA: Diagnosis not present

## 2018-08-31 DIAGNOSIS — E7800 Pure hypercholesterolemia, unspecified: Secondary | ICD-10-CM | POA: Diagnosis not present

## 2018-08-31 DIAGNOSIS — E119 Type 2 diabetes mellitus without complications: Secondary | ICD-10-CM | POA: Diagnosis not present

## 2018-08-31 DIAGNOSIS — N3281 Overactive bladder: Secondary | ICD-10-CM | POA: Diagnosis not present

## 2018-08-31 DIAGNOSIS — Z79899 Other long term (current) drug therapy: Secondary | ICD-10-CM | POA: Diagnosis not present

## 2018-08-31 DIAGNOSIS — Z713 Dietary counseling and surveillance: Secondary | ICD-10-CM | POA: Diagnosis not present

## 2018-12-20 DIAGNOSIS — I10 Essential (primary) hypertension: Secondary | ICD-10-CM | POA: Diagnosis not present

## 2018-12-20 DIAGNOSIS — Z79899 Other long term (current) drug therapy: Secondary | ICD-10-CM | POA: Diagnosis not present

## 2018-12-20 DIAGNOSIS — E119 Type 2 diabetes mellitus without complications: Secondary | ICD-10-CM | POA: Diagnosis not present

## 2018-12-20 DIAGNOSIS — E7800 Pure hypercholesterolemia, unspecified: Secondary | ICD-10-CM | POA: Diagnosis not present

## 2018-12-20 DIAGNOSIS — Z7984 Long term (current) use of oral hypoglycemic drugs: Secondary | ICD-10-CM | POA: Diagnosis not present

## 2018-12-20 DIAGNOSIS — E118 Type 2 diabetes mellitus with unspecified complications: Secondary | ICD-10-CM | POA: Diagnosis not present

## 2019-01-19 DIAGNOSIS — E118 Type 2 diabetes mellitus with unspecified complications: Secondary | ICD-10-CM | POA: Diagnosis not present

## 2019-01-19 DIAGNOSIS — Z7984 Long term (current) use of oral hypoglycemic drugs: Secondary | ICD-10-CM | POA: Diagnosis not present

## 2019-02-07 DIAGNOSIS — J309 Allergic rhinitis, unspecified: Secondary | ICD-10-CM | POA: Diagnosis not present

## 2019-02-07 DIAGNOSIS — R05 Cough: Secondary | ICD-10-CM | POA: Diagnosis not present

## 2019-03-18 DIAGNOSIS — Z79899 Other long term (current) drug therapy: Secondary | ICD-10-CM | POA: Diagnosis not present

## 2019-03-18 DIAGNOSIS — E7800 Pure hypercholesterolemia, unspecified: Secondary | ICD-10-CM | POA: Diagnosis not present

## 2019-03-18 DIAGNOSIS — I10 Essential (primary) hypertension: Secondary | ICD-10-CM | POA: Diagnosis not present

## 2019-03-18 DIAGNOSIS — E118 Type 2 diabetes mellitus with unspecified complications: Secondary | ICD-10-CM | POA: Diagnosis not present

## 2019-03-18 DIAGNOSIS — N3281 Overactive bladder: Secondary | ICD-10-CM | POA: Diagnosis not present

## 2019-03-21 DIAGNOSIS — Z79899 Other long term (current) drug therapy: Secondary | ICD-10-CM | POA: Diagnosis not present

## 2019-03-21 DIAGNOSIS — R2241 Localized swelling, mass and lump, right lower limb: Secondary | ICD-10-CM | POA: Diagnosis not present

## 2019-03-21 DIAGNOSIS — Z7984 Long term (current) use of oral hypoglycemic drugs: Secondary | ICD-10-CM | POA: Diagnosis not present

## 2019-03-21 DIAGNOSIS — E7800 Pure hypercholesterolemia, unspecified: Secondary | ICD-10-CM | POA: Diagnosis not present

## 2019-03-21 DIAGNOSIS — N401 Enlarged prostate with lower urinary tract symptoms: Secondary | ICD-10-CM | POA: Diagnosis not present

## 2019-03-21 DIAGNOSIS — Z0001 Encounter for general adult medical examination with abnormal findings: Secondary | ICD-10-CM | POA: Diagnosis not present

## 2019-03-21 DIAGNOSIS — B351 Tinea unguium: Secondary | ICD-10-CM | POA: Diagnosis not present

## 2019-03-21 DIAGNOSIS — E119 Type 2 diabetes mellitus without complications: Secondary | ICD-10-CM | POA: Diagnosis not present

## 2019-03-21 DIAGNOSIS — Z713 Dietary counseling and surveillance: Secondary | ICD-10-CM | POA: Diagnosis not present

## 2019-04-13 DIAGNOSIS — H40033 Anatomical narrow angle, bilateral: Secondary | ICD-10-CM | POA: Diagnosis not present

## 2019-04-13 DIAGNOSIS — H2513 Age-related nuclear cataract, bilateral: Secondary | ICD-10-CM | POA: Diagnosis not present

## 2021-08-31 ENCOUNTER — Emergency Department (HOSPITAL_BASED_OUTPATIENT_CLINIC_OR_DEPARTMENT_OTHER): Payer: Medicare HMO

## 2021-08-31 ENCOUNTER — Emergency Department (HOSPITAL_BASED_OUTPATIENT_CLINIC_OR_DEPARTMENT_OTHER)
Admission: EM | Admit: 2021-08-31 | Discharge: 2021-08-31 | Disposition: A | Discharge status: Home / Self Care | Payer: Medicare HMO | Attending: Emergency Medicine | Admitting: Emergency Medicine

## 2021-08-31 ENCOUNTER — Other Ambulatory Visit: Payer: Self-pay

## 2021-08-31 ENCOUNTER — Encounter (HOSPITAL_BASED_OUTPATIENT_CLINIC_OR_DEPARTMENT_OTHER): Payer: Self-pay

## 2021-08-31 DIAGNOSIS — I10 Essential (primary) hypertension: Secondary | ICD-10-CM | POA: Insufficient documentation

## 2021-08-31 DIAGNOSIS — E119 Type 2 diabetes mellitus without complications: Secondary | ICD-10-CM | POA: Insufficient documentation

## 2021-08-31 DIAGNOSIS — Z7982 Long term (current) use of aspirin: Secondary | ICD-10-CM | POA: Diagnosis not present

## 2021-08-31 DIAGNOSIS — J205 Acute bronchitis due to respiratory syncytial virus: Secondary | ICD-10-CM | POA: Diagnosis not present

## 2021-08-31 DIAGNOSIS — Z7984 Long term (current) use of oral hypoglycemic drugs: Secondary | ICD-10-CM | POA: Insufficient documentation

## 2021-08-31 DIAGNOSIS — Z79899 Other long term (current) drug therapy: Secondary | ICD-10-CM | POA: Insufficient documentation

## 2021-08-31 DIAGNOSIS — R059 Cough, unspecified: Secondary | ICD-10-CM | POA: Diagnosis present

## 2021-08-31 DIAGNOSIS — R051 Acute cough: Secondary | ICD-10-CM

## 2021-08-31 DIAGNOSIS — Z20822 Contact with and (suspected) exposure to covid-19: Secondary | ICD-10-CM | POA: Insufficient documentation

## 2021-08-31 LAB — CBC
HCT: 41.4 % (ref 39.0–52.0)
Hemoglobin: 13.4 g/dL (ref 13.0–17.0)
MCH: 29.1 pg (ref 26.0–34.0)
MCHC: 32.4 g/dL (ref 30.0–36.0)
MCV: 90 fL (ref 80.0–100.0)
Platelets: 249 10*3/uL (ref 150–400)
RBC: 4.6 MIL/uL (ref 4.22–5.81)
RDW: 13.2 % (ref 11.5–15.5)
WBC: 4.4 10*3/uL (ref 4.0–10.5)
nRBC: 0 % (ref 0.0–0.2)

## 2021-08-31 LAB — BASIC METABOLIC PANEL
Anion gap: 9 (ref 5–15)
BUN: 11 mg/dL (ref 8–23)
CO2: 30 mmol/L (ref 22–32)
Calcium: 9.6 mg/dL (ref 8.9–10.3)
Chloride: 96 mmol/L — ABNORMAL LOW (ref 98–111)
Creatinine, Ser: 0.79 mg/dL (ref 0.61–1.24)
GFR, Estimated: >60 mL/min (ref >60)
Glucose, Bld: 227 mg/dL — ABNORMAL HIGH (ref 70–99)
Potassium: 4.2 mmol/L (ref 3.5–5.1)
Sodium: 135 mmol/L (ref 135–145)

## 2021-08-31 LAB — TROPONIN I (HIGH SENSITIVITY): Troponin I (High Sensitivity): 4 ng/L (ref <18)

## 2021-08-31 LAB — RESP PANEL BY RT-PCR (FLU A&B, COVID) ARPGX2
Influenza A by PCR: NEGATIVE
Influenza B by PCR: NEGATIVE
SARS Coronavirus 2 by RT PCR: NEGATIVE

## 2021-08-31 LAB — CBG MONITORING, ED: Glucose-Capillary: 219 mg/dL — ABNORMAL HIGH (ref 70–99)

## 2021-08-31 MED ORDER — BENZONATATE 100 MG PO CAPS: 100.0000 mg | ORAL_CAPSULE | Freq: Three times a day (TID) | ORAL | 0 refills | Status: DC | PRN

## 2021-08-31 MED ORDER — ALBUTEROL SULFATE HFA 108 (90 BASE) MCG/ACT IN AERS
2.0000 | INHALATION_SPRAY | Freq: Once | RESPIRATORY_TRACT | Status: AC
  Administered 2021-08-31: 2 via RESPIRATORY_TRACT
  Filled 2021-08-31: qty 6.7

## 2021-08-31 NOTE — ED Provider Notes (Signed)
[facility] EMERGENCY DEPARTMENT Provider Note   CSN: [id number] Arrival date & time: 08/31/21  1719     History Chief Complaint  Patient presents with   Cough    Alex Sanchez is a 75 y.o. male.  HPI     Randie Heinz grandson has RSV, was in hospital for it Got the flu shot about a week ago, day after that it started One week of cough, medications not helping, (has had cough off and on for al ong time) coughed up phlegm a few times, now is dry cough. No congestion. Has had sore throat. Tried to drink something hot and take pain pill for that.  Uncontrollable coughing for about a week. It got better than worsened.  No known fever. No body aches, nausea. Vomited once.  No chest pain. No leg pain or swelling Off of ozempic for 3 weeks  Compression socks helping with swelling, chronic   Past Medical History:  Diagnosis Date   Diabetes mellitus    Hypertension     There are no problems to display for this patient.   Past Surgical History:  Procedure Laterality Date   BACK SURGERY         No family history on file.  Social History   Tobacco Use   Smoking status: Never   Smokeless tobacco: Never  Vaping Use   Vaping Use: Never used  Substance Use Topics   Alcohol use: No   Drug use: No    Home Medications Prior to Admission medications   Medication Sig Start Date End Date Taking? Authorizing Provider  aspirin 81 MG chewable tablet Chew 81 mg by mouth daily.   Yes [provider]  atorvastatin (LIPITOR) 20 MG tablet Take 20 mg by mouth daily.   Yes [provider]  benzonatate (TESSALON) 100 MG capsule Take 1 capsule (100 mg total) by mouth 3 (three) times daily as needed for cough (KEEP AWAY FROM CHILDREN). 08/31/21  Yes Alvira Monday, MD  glipiZIDE (GLUCOTROL XL) 5 MG 24 hr tablet Take 5 mg by mouth daily.     Yes [provider]  glipiZIDE (GLUCOTROL) 10 MG tablet Take 10 mg by mouth 2 (two) times daily before a meal.    Yes [provider]  metFORMIN (GLUCOPHAGE) 500 MG tablet Take 500-1,000 mg by mouth 2 (two) times daily with a meal. Take 2 tablets in AM and 1 tablet in PM   Yes [provider]  Multiple Vitamins-Minerals (MULTIVITAMIN WITH MINERALS) tablet Take 1 tablet by mouth daily.   Yes [provider]  pioglitazone (ACTOS) 30 MG tablet Take 30 mg by mouth daily.   Yes [provider]  simvastatin (ZOCOR) 40 MG tablet Take 40 mg by mouth daily.   Yes [provider]  mometasone (NASONEX) 50 MCG/ACT nasal spray Place 2 sprays into the nose daily. 03/27/13 03/30/13  Gerhard Munch, MD    Allergies    Patient has no known allergies.  Review of Systems   Review of Systems  Constitutional:  Negative for fever.  HENT:  Positive for sore throat.   Eyes:  Negative for visual disturbance.  Respiratory:  Positive for cough and shortness of breath.   Cardiovascular:  Negative for chest pain.  Gastrointestinal:  Positive for vomiting (once). Negative for abdominal pain and nausea.  Genitourinary:  Negative for difficulty urinating.  Musculoskeletal:  Negative for back pain and neck stiffness.  Skin:  Negative for rash.  Neurological:  Negative for  syncope and headaches.   Physical Exam Updated Vital Signs BP (!) 148/68   Pulse 88   Temp 98.8 F (37.1 C) (Oral)   Resp 20   Ht 6' (1.829 m)   Wt 76.2 kg   SpO2 99%   BMI 22.78 kg/m   Physical Exam Vitals and nursing note reviewed.  Constitutional:      General: He is not in acute distress.    Appearance: He is well-developed. He is not diaphoretic.  HENT:     Head: Normocephalic and atraumatic.  Eyes:     Conjunctiva/sclera: Conjunctivae normal.  Cardiovascular:     Rate and Rhythm: Normal rate and regular rhythm.     Heart sounds: Normal heart sounds. No murmur heard.   No friction rub. No gallop.  Pulmonary:     Effort: Pulmonary effort is normal. No respiratory distress.     Breath sounds:  Wheezing (end expiratory) present. No rales.     Comments: Frequent cough Abdominal:     General: There is no distension.     Palpations: Abdomen is soft.     Tenderness: There is no abdominal tenderness. There is no guarding.  Musculoskeletal:     Cervical back: Normal range of motion.     Left lower leg: Left lower leg edema: denies change from baseline.  Skin:    General: Skin is warm and dry.  Neurological:     Mental Status: He is alert and oriented to person, place, and time.    ED Results / Procedures / Treatments   Labs (all labs ordered are listed, but only abnormal results are displayed) Labs Reviewed  BASIC METABOLIC PANEL - Abnormal; Notable for the following components:      Result Value   Chloride 96 (*)    Glucose, Bld 227 (*)    All other components within normal limits  CBG MONITORING, ED - Abnormal; Notable for the following components:   Glucose-Capillary 219 (*)    All other components within normal limits  RESP PANEL BY RT-PCR (FLU A&B, COVID) ARPGX2  CBC  TROPONIN I (HIGH SENSITIVITY)    EKG EKG Interpretation  Date/Time:  Saturday August 31 2021 18:16:16 EDT Ventricular Rate:  104 PR Interval:  170 QRS Duration: 76 QT Interval:  312 QTC Calculation: 410 R Axis:   71 Text Interpretation: Sinus tachycardia Otherwise normal ECG  TW changes seen previously resolved Confirmed by Alvira Monday ([id number]) on 08/31/2021 7:15:10 PM  Radiology DG Chest 2 View  Result Date: 08/31/2021 CLINICAL DATA:  Cough EXAM: CHEST - 2 VIEW COMPARISON:  Chest x-ray 02/10/2018, CT chest 01/30/2014 FINDINGS: The heart and mediastinal contours are within normal limits. No focal consolidation. No pulmonary edema. No pleural effusion. No pneumothorax. No acute osseous abnormality. Acromioclavicular degenerative changes. IMPRESSION: No active cardiopulmonary disease. Electronically Signed   By: Tish Frederickson M.D.   On: 08/31/2021 18:56    Procedures Procedures    Medications Ordered in ED Medications  albuterol (VENTOLIN HFA) 108 (90 Base) MCG/ACT inhaler 2 puff (2 puffs Inhalation Given 08/31/21 1944)    ED Course  I have reviewed the triage vital signs and the nursing notes.  Pertinent labs & imaging results that were available during my care of the patient were reviewed by me and considered in my medical decision making (see chart for details).    MDM Rules/Calculators/A&P  75 year old male with history of diabetes and hypertension presents with concern for cough, sore throat and shortness of breath.  Chest x-ray was about by me and shows no evidence of pneumonia, pneumothorax, or pulmonary edema.  EKG is without acute findings.  Troponin is negative and have low suspicion for ACS.  COVID and influenza testing are negative.  No sign of anemia.  Glucose is mildly elevated without signs of other acute significant lecture abnormalities or DKA.  Have low suspicion for pulmonary embolus given significant cough, sore throat, and sick contact, no new leg pain or swelling, no hypoxia. Reports having some improvement with albuterol in the past, was noted to have some mild wheezing on exam.  Suspect likely bronchitis secondary to RSV given sick contact with this. Given albuterol, tessalon. Discussed that if his symptoms are worsening, recommend return to the emergency department for further evaluation.   Final Clinical Impression(s) / ED Diagnoses Final diagnoses:  RSV bronchitis  Acute cough    Rx / DC Orders ED Discharge Orders          Ordered    benzonatate (TESSALON) 100 MG capsule  3 times daily PRN        08/31/21 1938             Alvira Monday, MD 09/02/21 [phone]

## 2021-08-31 NOTE — ED Triage Notes (Signed)
Pt arrives with reports of being exposed to child with RSV reports he has been using OTC medications for a cough ever since, states medication is not helping. Pt reports cough is dry.

## 2021-08-31 NOTE — ED Notes (Signed)
RT assessed in triage. Patient stated he has had some SOB and cough. BBS clear. SAT 100%

## 2021-08-31 NOTE — ED Notes (Signed)
Respiratory at bedside administering inhaler

## 2021-11-22 ENCOUNTER — Ambulatory Visit (HOSPITAL_COMMUNITY)
Admission: EM | Admit: 2021-11-22 | Discharge: 2021-11-22 | Disposition: A | Discharge status: Home / Self Care | Payer: Medicare HMO | Attending: Student | Admitting: Student

## 2021-11-22 ENCOUNTER — Other Ambulatory Visit: Payer: Self-pay

## 2021-11-22 ENCOUNTER — Ambulatory Visit (INDEPENDENT_AMBULATORY_CARE_PROVIDER_SITE_OTHER): Payer: Medicare HMO

## 2021-11-22 ENCOUNTER — Encounter (HOSPITAL_COMMUNITY): Payer: Self-pay

## 2021-11-22 DIAGNOSIS — E118 Type 2 diabetes mellitus with unspecified complications: Secondary | ICD-10-CM | POA: Diagnosis not present

## 2021-11-22 DIAGNOSIS — R052 Subacute cough: Secondary | ICD-10-CM | POA: Diagnosis not present

## 2021-11-22 DIAGNOSIS — E1169 Type 2 diabetes mellitus with other specified complication: Secondary | ICD-10-CM

## 2021-11-22 DIAGNOSIS — R059 Cough, unspecified: Secondary | ICD-10-CM | POA: Diagnosis not present

## 2021-11-22 MED ORDER — METHYLPREDNISOLONE SODIUM SUCC 125 MG IJ SOLR
INTRAMUSCULAR | Status: AC
  Filled 2021-11-22: qty 2

## 2021-11-22 MED ORDER — LORATADINE 10 MG PO TABS: 10.0000 mg | ORAL_TABLET | Freq: Every day | ORAL | 2 refills | Status: DC

## 2021-11-22 MED ORDER — METHYLPREDNISOLONE SODIUM SUCC 125 MG IJ SOLR
60.0000 mg | Freq: Once | INTRAMUSCULAR | Status: AC
  Administered 2021-11-22: 60 mg via INTRAMUSCULAR

## 2021-11-22 NOTE — Discharge Instructions (Addendum)
-  Start inhaler as directed -Claritin once daily for at least 2 weeks  -Follow-up with primary care if symptoms persist - they would likely refer you to a pulmonologist  -Follow-up if symptoms worsen - shortness of breath, chest pain, fevers, etc.

## 2021-11-22 NOTE — ED Triage Notes (Signed)
Pt presents with c/o a chronic cough.   States he has been coughing for months and cannot get rid of it. States he has had a sore throat off and on. States he has spells of a cough that he cannot stop.

## 2021-11-22 NOTE — ED Provider Notes (Signed)
[place]    CSN: [id number] Arrival date & time: 11/22/21  1025      History   Chief Complaint Chief Complaint  Patient presents with   Cough   Sore Throat   sneezing    HPI Alex Sanchez is a 76 y.o. male presenting with cough x3 months following suspected RSV. Medical history secondhand smoke exposure. Has already been seen by ED and PCP for this. No SOB. Cough is nonproductive - initially productive but this has resolved. Never smoker but secondhand smoke exposure. Albuterol inhaler was sent but he did not try this. States he thinks it might be allergies but is not taking any medications for this.   HPI  Past Medical History:  Diagnosis Date   Diabetes mellitus    Hypertension     There are no problems to display for this patient.   Past Surgical History:  Procedure Laterality Date   BACK SURGERY         Home Medications    Prior to Admission medications   Medication Sig Start Date End Date Taking? Authorizing Provider  loratadine (CLARITIN) 10 MG tablet Take 1 tablet (10 mg total) by mouth daily. 11/22/21  Yes Hazel Sams, PA-C  aspirin 81 MG chewable tablet Chew 81 mg by mouth daily.    [provider]  atorvastatin (LIPITOR) 20 MG tablet Take 20 mg by mouth daily.    [provider]  benzonatate (TESSALON) 100 MG capsule Take 1 capsule (100 mg total) by mouth 3 (three) times daily as needed for cough (KEEP AWAY FROM CHILDREN). 08/31/21   Gareth Morgan, MD  glipiZIDE (GLUCOTROL XL) 5 MG 24 hr tablet Take 5 mg by mouth daily.      [provider]  glipiZIDE (GLUCOTROL) 10 MG tablet Take 10 mg by mouth 2 (two) times daily before a meal.    [provider]  metFORMIN (GLUCOPHAGE) 500 MG tablet Take 500-1,000 mg by mouth 2 (two) times daily with a meal. Take 2 tablets in AM and 1 tablet in PM    [provider]  mometasone (NASONEX) 50 MCG/ACT nasal spray Place 2 sprays into the nose daily. 03/27/13  03/30/13  Carmin Muskrat, MD  Multiple Vitamins-Minerals (MULTIVITAMIN WITH MINERALS) tablet Take 1 tablet by mouth daily.    [provider]  pioglitazone (ACTOS) 30 MG tablet Take 30 mg by mouth daily.    [provider]  simvastatin (ZOCOR) 40 MG tablet Take 40 mg by mouth daily.    [provider]    Family History History reviewed. No pertinent family history.  Social History Social History   Tobacco Use   Smoking status: Never   Smokeless tobacco: Never  Vaping Use   Vaping Use: Never used  Substance Use Topics   Alcohol use: No   Drug use: No     Allergies   Patient has no known allergies.   Review of Systems Review of Systems  Constitutional:  Negative for appetite change, chills and fever.  HENT:  Negative for congestion, ear pain, rhinorrhea, sinus pressure, sinus pain and sore throat.   Eyes:  Negative for redness and visual disturbance.  Respiratory:  Positive for cough. Negative for chest tightness, shortness of breath and wheezing.   Cardiovascular:  Negative for chest pain and palpitations.  Gastrointestinal:  Negative for abdominal pain, constipation, diarrhea, nausea and vomiting.  Genitourinary:  Negative for dysuria, frequency and urgency.  Musculoskeletal:  Negative for myalgias.  Neurological:  Negative for dizziness, weakness and headaches.  Psychiatric/Behavioral:  Negative for confusion.   All other systems reviewed and are negative.   Physical Exam Triage Vital Signs ED Triage Vitals  Enc Vitals Group     BP 11/22/21 1129 (!) 157/73     Pulse Rate 11/22/21 1129 91     Resp 11/22/21 1129 16     Temp 11/22/21 1129 98.6 F (37 C)     Temp Source 11/22/21 1129 Oral     SpO2 11/22/21 1129 94 %     Weight --      Height --      Head Circumference --      Peak Flow --      Pain Score 11/22/21 1128 0     Pain Loc --      Pain Edu? --      Excl. in [place]? --    No data found.  Updated Vital Signs BP (!) 157/73  (BP Location: Right Arm)    Pulse 91    Temp 98.6 F (37 C) (Oral)    Resp 16    SpO2 94%   Visual Acuity Right Eye Distance:   Left Eye Distance:   Bilateral Distance:    Right Eye Near:   Left Eye Near:    Bilateral Near:     Physical Exam Vitals reviewed.  Constitutional:      General: He is not in acute distress.    Appearance: Normal appearance. He is not ill-appearing.  HENT:     Head: Normocephalic and atraumatic.     Right Ear: Tympanic membrane, ear canal and external ear normal. No tenderness. No middle ear effusion. There is no impacted cerumen. Tympanic membrane is not perforated, erythematous, retracted or bulging.     Left Ear: Tympanic membrane, ear canal and external ear normal. No tenderness.  No middle ear effusion. There is no impacted cerumen. Tympanic membrane is not perforated, erythematous, retracted or bulging.     Nose: Nose normal. No congestion.     Mouth/Throat:     Mouth: Mucous membranes are moist.     Pharynx: Uvula midline. No oropharyngeal exudate or posterior oropharyngeal erythema.  Eyes:     Extraocular Movements: Extraocular movements intact.     Pupils: Pupils are equal, round, and reactive to light.  Cardiovascular:     Rate and Rhythm: Normal rate and regular rhythm.     Heart sounds: Normal heart sounds.  Pulmonary:     Effort: Pulmonary effort is normal.     Breath sounds: Normal breath sounds. No decreased breath sounds, wheezing, rhonchi or rales.  Abdominal:     Palpations: Abdomen is soft.     Tenderness: There is no abdominal tenderness. There is no guarding or rebound.  Lymphadenopathy:     Cervical: No cervical adenopathy.     Right cervical: No superficial cervical adenopathy.    Left cervical: No superficial cervical adenopathy.  Neurological:     General: No focal deficit present.     Mental Status: He is alert and oriented to person, place, and time.  Psychiatric:        Mood and Affect: Mood normal.        Behavior:  Behavior normal.        Thought Content: Thought content normal.        Judgment: Judgment normal.     UC Treatments / Results  Labs (all labs ordered are listed, but only abnormal results  are displayed) Labs Reviewed - No data to display  EKG   Radiology DG Chest 2 View  Result Date: 11/22/2021 CLINICAL DATA:  Cough EXAM: CHEST - 2 VIEW COMPARISON:  08/31/2021 FINDINGS: Normal heart size, mediastinal contours, and pulmonary vascularity. Lungs clear. No pulmonary infiltrate, pleural effusion, or pneumothorax. Deformities of upper lateral LEFT ribs unchanged. No acute osseous findings. IMPRESSION: No acute abnormalities. Electronically Signed   By: Lavonia Dana M.D.   On: 11/22/2021 11:51    Procedures Procedures (including critical care time)  Medications Ordered in UC Medications  methylPREDNISolone sodium succinate (SOLU-MEDROL) 125 mg/2 mL injection 60 mg (60 mg Intramuscular Given 11/22/21 1221)    Initial Impression / Assessment and Plan / UC Course  I have reviewed the triage vital signs and the nursing notes.  Pertinent labs & imaging results that were available during my care of the patient were reviewed by me and considered in my medical decision making (see chart for details).     This patient is a very pleasant 76 y.o. year old male presenting with cough x3 months following RSV. Was seen in the ED for this 08/2021, RSV test was negative despite exposure to this. History secondhand smoke exposure but pt is never smoker. CXR - No acute abnormalities. Saw PCP for this 1 week ago, they sent in an inhaler which he did not start. Also with untreated allergies. Rec starting inhaler, and I sent claritin today. F/u with PCP if symptoms persist, would likely need a referral to pulm for PFTs. For diabetes - nonfasting sugars 130 this morning. IM solumedrol administered at family member request. ED return precautions discussed. Patient verbalizes understanding and agreement.      Final Clinical Impressions(s) / UC Diagnoses   Final diagnoses:  Subacute cough  Type 2 diabetes mellitus with other specified complication, without long-term current use of insulin (HCC)     Discharge Instructions      -Start inhaler as directed -Claritin once daily for at least 2 weeks  -Follow-up with primary care if symptoms persist - they would likely refer you to a pulmonologist  -Follow-up if symptoms worsen - shortness of breath, chest pain, fevers, etc.      ED Prescriptions     Medication Sig Dispense Auth. Provider   loratadine (CLARITIN) 10 MG tablet Take 1 tablet (10 mg total) by mouth daily. 30 tablet Hazel Sams, PA-C      PDMP not reviewed this encounter.   Hazel Sams, PA-C 11/22/21 1223

## 2021-11-22 NOTE — ED Triage Notes (Signed)
Pt states he would like a chest x-ray done.

## 2021-12-16 ENCOUNTER — Ambulatory Visit: Payer: Self-pay | Admitting: *Deleted

## 2021-12-16 NOTE — Telephone Encounter (Signed)
Reason for Disposition  [1] Continuous (nonstop) coughing interferes with work or school AND [2] no improvement using cough treatment per Care Advice  Answer Assessment - Initial Assessment Questions 1. ONSET: "When did the cough begin?"      I had Covid positive on 11/22/2021 at the urgent care. I've still have this dry cough that won't go away.   No fever.  I just wanted to check and see what I need to do.  I have diabetes.   I have a PCP in .   I suggested he contact his PCP so he was agreeable to doing that. 2. SEVERITY: "How bad is the cough today?"      It's not going away.    "I've had it a while" 3. SPUTUM: "Describe the color of your sputum" (none, dry cough; clear, white, yellow, green)     Dry cough 4. HEMOPTYSIS: "Are you coughing up any blood?" If so ask: "How much?" (flecks, streaks, tablespoons, etc.)     No 5. DIFFICULTY BREATHING: "Are you having difficulty breathing?" If Yes, ask: "How bad is it?" (e.g., mild, moderate, severe)    - MILD: No SOB at rest, mild SOB with walking, speaks normally in sentences, can lie down, no retractions, pulse < 100.    - MODERATE: SOB at rest, SOB with minimal exertion and prefers to sit, cannot lie down flat, speaks in phrases, mild retractions, audible wheezing, pulse 100-120.    - SEVERE: Very SOB at rest, speaks in single words, struggling to breathe, sitting hunched forward, retractions, pulse > 120      *No Answer* 6. FEVER: "Do you have a fever?" If Yes, ask: "What is your temperature, how was it measured, and when did it start?"     *No Answer* 7. CARDIAC HISTORY: "Do you have any history of heart disease?" (e.g., heart attack, congestive heart failure)      *No Answer* 8. LUNG HISTORY: "Do you have any history of lung disease?"  (e.g., pulmonary embolus, asthma, emphysema)     *No Answer* 9. PE RISK FACTORS: "Do you have a history of blood clots?" (or: recent major surgery, recent prolonged travel, bedridden)     *No  Answer* 10. OTHER SYMPTOMS: "Do you have any other symptoms?" (e.g., runny nose, wheezing, chest pain)       *No Answer* 11. PREGNANCY: "Is there any chance you are pregnant?" "When was your last menstrual period?"       *No Answer* 12. TRAVEL: "Have you traveled out of the country in the last month?" (e.g., travel history, exposures)       *No Answer*  Protocols used: Cough - Acute Non-Productive-A-AH He is going to call his PCP

## 2021-12-31 ENCOUNTER — Other Ambulatory Visit (HOSPITAL_COMMUNITY): Payer: Self-pay | Admitting: Family Medicine

## 2021-12-31 DIAGNOSIS — U099 Post covid-19 condition, unspecified: Secondary | ICD-10-CM

## 2021-12-31 DIAGNOSIS — R051 Acute cough: Secondary | ICD-10-CM

## 2022-01-01 ENCOUNTER — Other Ambulatory Visit: Payer: Self-pay

## 2022-01-01 ENCOUNTER — Encounter (HOSPITAL_COMMUNITY): Payer: Self-pay | Admitting: *Deleted

## 2022-01-01 ENCOUNTER — Emergency Department (HOSPITAL_COMMUNITY): Payer: Medicare HMO

## 2022-01-01 ENCOUNTER — Emergency Department (HOSPITAL_COMMUNITY)
Admission: EM | Admit: 2022-01-01 | Discharge: 2022-01-01 | Disposition: A | Discharge status: Home / Self Care | Payer: Medicare HMO | Attending: Emergency Medicine | Admitting: Emergency Medicine

## 2022-01-01 DIAGNOSIS — Z8616 Personal history of COVID-19: Secondary | ICD-10-CM | POA: Diagnosis not present

## 2022-01-01 DIAGNOSIS — R053 Chronic cough: Secondary | ICD-10-CM | POA: Diagnosis not present

## 2022-01-01 DIAGNOSIS — R059 Cough, unspecified: Secondary | ICD-10-CM | POA: Diagnosis present

## 2022-01-01 DIAGNOSIS — Z7982 Long term (current) use of aspirin: Secondary | ICD-10-CM | POA: Insufficient documentation

## 2022-01-01 HISTORY — DX: Benign prostatic hyperplasia without lower urinary tract symptoms: N40.0

## 2022-01-01 LAB — COMPREHENSIVE METABOLIC PANEL
ALT: 16 U/L (ref 0–44)
AST: 27 U/L (ref 15–41)
Albumin: 4.2 g/dL (ref 3.5–5.0)
Alkaline Phosphatase: 59 U/L (ref 38–126)
Anion gap: 6 (ref 5–15)
BUN: 11 mg/dL (ref 8–23)
CO2: 30 mmol/L (ref 22–32)
Calcium: 9.2 mg/dL (ref 8.9–10.3)
Chloride: 101 mmol/L (ref 98–111)
Creatinine, Ser: 0.71 mg/dL (ref 0.61–1.24)
GFR, Estimated: >60 mL/min (ref >60)
Glucose, Bld: 134 mg/dL — ABNORMAL HIGH (ref 70–99)
Potassium: 4.2 mmol/L (ref 3.5–5.1)
Sodium: 137 mmol/L (ref 135–145)
Total Bilirubin: 0.3 mg/dL (ref 0.3–1.2)
Total Protein: 7.5 g/dL (ref 6.5–8.1)

## 2022-01-01 LAB — CBC WITH DIFFERENTIAL/PLATELET
Abs Immature Granulocytes: 0 10*3/uL (ref 0.00–0.07)
Basophils Absolute: 0 10*3/uL (ref 0.0–0.1)
Basophils Relative: 0 %
Eosinophils Absolute: 0.1 10*3/uL (ref 0.0–0.5)
Eosinophils Relative: 2 %
HCT: 41.8 % (ref 39.0–52.0)
Hemoglobin: 12.5 g/dL — ABNORMAL LOW (ref 13.0–17.0)
Immature Granulocytes: 0 %
Lymphocytes Relative: 16 %
Lymphs Abs: 0.7 10*3/uL (ref 0.7–4.0)
MCH: 28.1 pg (ref 26.0–34.0)
MCHC: 29.9 g/dL — ABNORMAL LOW (ref 30.0–36.0)
MCV: 93.9 fL (ref 80.0–100.0)
Monocytes Absolute: 0.3 10*3/uL (ref 0.1–1.0)
Monocytes Relative: 6 %
Neutro Abs: 3.1 10*3/uL (ref 1.7–7.7)
Neutrophils Relative %: 76 %
Platelets: 238 10*3/uL (ref 150–400)
RBC: 4.45 MIL/uL (ref 4.22–5.81)
RDW: 13.8 % (ref 11.5–15.5)
WBC: 4.2 10*3/uL (ref 4.0–10.5)
nRBC: 0 % (ref 0.0–0.2)

## 2022-01-01 NOTE — ED Triage Notes (Signed)
Pt c/o intermittent episodes of coughing over the last 1-2 years. Pt reports the episodes are becoming worse. He was diagnosed with Covid 3 weeks ago.

## 2022-01-01 NOTE — Discharge Instructions (Signed)
Follow up to discuss CT of your chest and pulmonary function testing with your doctor. Return for new concerns.

## 2022-01-01 NOTE — ED Provider Notes (Signed)
[facility] Provider Note   CSN: [id number] Arrival date & time: 01/01/22  [id number]     History  No chief complaint on file.   Alex Sanchez is a 76 y.o. male.  Patient presents for recurrent cough over 2 years duration.  Patient seen primary care doctor and has tried albuterol and allergy medicines however no significant improvement.  Patient denies any known lung disease.  Smoked years ago no current smoking.  Patient diagnosed with COVID 3 weeks ago.  Patient had this cough before COVID.  Episodes gradually worsening.  No weight loss.  No fevers or chills.  No new medications.  Patient on blood pressure medication.  No specific associations.      Home Medications Prior to Admission medications   Medication Sig Start Date End Date Taking? Authorizing Provider  aspirin 81 MG chewable tablet Chew 81 mg by mouth daily.    [provider]  atorvastatin (LIPITOR) 20 MG tablet Take 20 mg by mouth daily.    [provider]  benzonatate (TESSALON) 100 MG capsule Take 1 capsule (100 mg total) by mouth 3 (three) times daily as needed for cough (KEEP AWAY FROM CHILDREN). 08/31/21   Gareth Morgan, MD  glipiZIDE (GLUCOTROL XL) 5 MG 24 hr tablet Take 5 mg by mouth daily.      [provider]  glipiZIDE (GLUCOTROL) 10 MG tablet Take 10 mg by mouth 2 (two) times daily before a meal.    [provider]  loratadine (CLARITIN) 10 MG tablet Take 1 tablet (10 mg total) by mouth daily. 11/22/21   Hazel Sams, PA-C  metFORMIN (GLUCOPHAGE) 500 MG tablet Take 500-1,000 mg by mouth 2 (two) times daily with a meal. Take 2 tablets in AM and 1 tablet in PM    [provider]  mometasone (NASONEX) 50 MCG/ACT nasal spray Place 2 sprays into the nose daily. 03/27/13 03/30/13  Carmin Muskrat, MD  Multiple Vitamins-Minerals (MULTIVITAMIN WITH MINERALS) tablet Take 1 tablet by mouth daily.    [provider]  pioglitazone (ACTOS) 30 MG  tablet Take 30 mg by mouth daily.    [provider]  simvastatin (ZOCOR) 40 MG tablet Take 40 mg by mouth daily.    [provider]      Allergies    Patient has no known allergies.    Review of Systems   Review of Systems  Constitutional:  Negative for chills and fever.  HENT:  Negative for congestion.   Eyes:  Negative for visual disturbance.  Respiratory:  Positive for cough. Negative for shortness of breath.   Cardiovascular:  Negative for chest pain.  Gastrointestinal:  Negative for abdominal pain and vomiting.  Genitourinary:  Negative for dysuria and flank pain.  Musculoskeletal:  Negative for back pain, neck pain and neck stiffness.  Skin:  Negative for rash.  Neurological:  Negative for light-headedness and headaches.   Physical Exam Updated Vital Signs BP 135/62 (BP Location: Right Arm)    Pulse 95    Temp 97.9 F (36.6 C) (Oral)    Resp 16    Ht 6' (1.829 m)    Wt 77.1 kg    SpO2 96%    BMI 23.06 kg/m  Physical Exam Vitals and nursing note reviewed.  Constitutional:      General: He is not in acute distress.    Appearance: He is well-developed.  HENT:     Head: Normocephalic and atraumatic.     Mouth/Throat:  Mouth: Mucous membranes are moist.  Eyes:     General:        Right eye: No discharge.        Left eye: No discharge.     Conjunctiva/sclera: Conjunctivae normal.  Neck:     Trachea: No tracheal deviation.  Cardiovascular:     Rate and Rhythm: Normal rate and regular rhythm.     Heart sounds: No murmur heard. Pulmonary:     Effort: Pulmonary effort is normal.     Breath sounds: Normal breath sounds.  Abdominal:     General: There is no distension.     Palpations: Abdomen is soft.     Tenderness: There is no abdominal tenderness. There is no guarding.  Musculoskeletal:        General: No swelling.     Cervical back: Normal range of motion and neck supple. No rigidity.  Skin:    General: Skin is warm.     Capillary Refill:  Capillary refill takes less than 2 seconds.  Neurological:     General: No focal deficit present.     Mental Status: He is alert.     Cranial Nerves: No cranial nerve deficit.  Psychiatric:        Mood and Affect: Mood normal.    ED Results / Procedures / Treatments   Labs (all labs ordered are listed, but only abnormal results are displayed) Labs Reviewed  COMPREHENSIVE METABOLIC PANEL - Abnormal; Notable for the following components:      Result Value   Glucose, Bld 134 (*)    All other components within normal limits  CBC WITH DIFFERENTIAL/PLATELET - Abnormal; Notable for the following components:   Hemoglobin 12.5 (*)    MCHC 29.9 (*)    All other components within normal limits  URINALYSIS, ROUTINE W REFLEX MICROSCOPIC    EKG None  Radiology DG Chest 2 View  Result Date: 01/01/2022 CLINICAL DATA:  Cough EXAM: CHEST - 2 VIEW COMPARISON:  Previous studies including the examination of 11/22/2021 FINDINGS: Cardiac size is within normal limits. There are no new infiltrates or signs of pulmonary edema. There is no pleural effusion or pneumothorax. Deformity in the left fourth rib has not changed. IMPRESSION: No active cardiopulmonary disease. Electronically Signed   By: Elmer Picker M.D.   On: 01/01/2022 12:36    Procedures Procedures    Medications Ordered in ED Medications - No data to display  ED Course/ Medical Decision Making/ A&P                           Medical Decision Making Amount and/or Complexity of Data Reviewed Labs: ordered. Radiology: ordered.   Patient presents with recurrent cough for over 2 years differential including GERD, allergies, pulmonary related, malignancy, medication related, other.  Patient in general blood work ordered and reviewed by myself no significant abnormalities.  Hemoglobin mild anemia 12.5 no active bleeding, white blood cell count normal, electrolytes and kidney function unremarkable.  Chest x-ray ordered and reviewed  by myself and showed patient no acute infiltrate, no obvious malignancy, no pneumothorax.  Discussed outpatient follow-up for consideration of CT scan and/or pulmonary function testing on a nonemergent basis.  Medications reviewed patient is not on ACE inhibitor.  No indication for admission or further testing at this time.  Patient stable for discharge        Final Clinical Impression(s) / ED Diagnoses Final diagnoses:  Chronic cough  Rx / DC Orders ED Discharge Orders     None         Elnora Morrison, MD 01/01/22 [phone]

## 2022-01-16 ENCOUNTER — Encounter: Payer: Self-pay | Admitting: Internal Medicine

## 2022-01-16 ENCOUNTER — Other Ambulatory Visit: Payer: Self-pay

## 2022-01-16 ENCOUNTER — Ambulatory Visit (INDEPENDENT_AMBULATORY_CARE_PROVIDER_SITE_OTHER): Payer: Medicare HMO | Admitting: Internal Medicine

## 2022-01-16 VITALS — BP 122/80 | HR 104 | Wt 182.4 lb

## 2022-01-16 DIAGNOSIS — R053 Chronic cough: Secondary | ICD-10-CM | POA: Diagnosis not present

## 2022-01-16 DIAGNOSIS — K219 Gastro-esophageal reflux disease without esophagitis: Secondary | ICD-10-CM | POA: Diagnosis not present

## 2022-01-16 DIAGNOSIS — R0982 Postnasal drip: Secondary | ICD-10-CM | POA: Diagnosis not present

## 2022-01-16 DIAGNOSIS — J3089 Other allergic rhinitis: Secondary | ICD-10-CM | POA: Diagnosis not present

## 2022-01-16 NOTE — Progress Notes (Signed)
? ?      ?Alex Sanchez    [id number]    [birth year] ? ?Primary Care Physician:McCorkle, Cassell Clement, FNP ? ?Referring Physician: Marylynn Pearson, FNP ?[address] ?[address] ?[place],  [place] [address] ?Reason for Consultation: chronic cough ?Date of Consultation: 01/16/2022 ? ?Chief complaint:   ?Chief Complaint  ?Patient presents with  ? Cough  ?  Patient has had a dry cough for one year dry irritating  ?   ?  ? ?HPI: ?Alex Sanchez is a 76 y.o. man with history of HTN on lisinopril who presents for new patient evaluation of cough.  ?He moved to a new home 16 months ago and cough preceded this. Probably more than 2 years.  ?Cough is non productive. Cough used to be worse at night but this has gotten better.  Cough has been better with allergy pills which PCP prescribed about 5-6 weeks ago. (Singulair and claritin.) ? ?He did have post nasal drainge but this is improving.  ? ?He denies dyspnea. He does have reflux occasionally when he eats too much at night usually about once a week and he takes pepto bismol. Cough is not usually waking him up at night, but sometimes gets worse when he lays down.  ? ?He denies itchy watery eyes, runny nose, sore throat.  ?He does get seasonal environmental allergies.  ? ?He was previously on lisinopril and about a month ago this was changed to losartan.  ? ?No childhood respiratory disease.  ? ?Notes that he is very anxious about getting older and is not ready to die. He notes his family is concerned about his cough and want to get to the bottom of things.  ? ?Social history: ? 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 After the [employer] he worked in 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  ? ?Smoking history: passive smoke exposure at work, never smoker.  ? ?Social History  ? ?Occupational History  ? Not on file  ?Tobacco Use  ? Smoking status: Never  ? Smokeless tobacco: Never  ?Vaping Use  ?  Vaping Use: Never used  ?Substance and Sexual Activity  ? Alcohol use: No  ? Drug use: No  ? Sexual activity: Not on file  ? ? ?Relevant family history: ? ?Family History  ?Problem Relation Age of Onset  ? Asthma Neg Hx   ? ? ?Past Medical History:  ?Diagnosis Date  ? Diabetes mellitus   ? Enlarged prostate   ? Hypertension   ? ? ?Past Surgical History:  ?Procedure Laterality Date  ? BACK SURGERY    ? CATARACT EXTRACTION Left   ? ? ? ?Physical Exam: ?Blood pressure 122/80, pulse (!) 104, weight 182 lb 6.4 oz (82.7 kg), SpO2 98 %. ?Gen:      No acute distress ?ENT:  mild cobblestoning in oropharynx. Mild erythema. no nasal polyps, mucus membranes moist ?Lungs:    No increased respiratory effort, symmetric chest wall excursion, clear to auscultation bilaterally, no wheezes or crackles ?CV:         Regular rate and rhythm; no murmurs, rubs, or gallops.  No pedal edema ?Abd:      + bowel sounds; soft, non-tender; no distension ?MSK: no acute synovitis of DIP or PIP joints, no mechanics hands.  ?Skin:      Warm and dry; no rashes ?Neuro: normal speech, no focal facial asymmetry ?Psych: anxious.  ? ? ?  Data Reviewed/Medical Decision Making: ? ?Independent interpretation of tests: ?Imaging: ? Review of patient's chest xray images Jan and Feb 2023 revealed no acute cardiopulmonary disease. The patient's images have been independently reviewed by me.   ? ?PFTs: ? ?No flowsheet data found. ? ?Labs:  ?Lab Results  ?Component Value Date  ? WBC 4.2 01/01/2022  ? HGB 12.5 (L) 01/01/2022  ? HCT 41.8 01/01/2022  ? MCV 93.9 01/01/2022  ? PLT 238 01/01/2022  ? ?Lab Results  ?Component Value Date  ? NA 137 01/01/2022  ? K 4.2 01/01/2022  ? CL 101 01/01/2022  ? CO2 30 01/01/2022  ? ? ? ? ?Immunization status:  ?Immunization History  ?Administered Date(s) Administered  ? Influenza-Unspecified 09/10/2018  ? ? ? I reviewed prior external note(s) from PCP ? I reviewed the result(s) of the labs and imaging as noted above.  ? I have  ordered ? ?Assessment:  ?Chronic Cough ?Post nasal drainage ?Allergic Rhinitis ?GERD ? ?Plan/Recommendations: ? ?Continue flonase/nasonex. Technique modified.  ?Continue claritin and singulair.  ?He has intermittent GERD. If the symptoms of coughing worsen would consider trial of PPI.  ? ?We discussed disease management and progression at length today.  ? ?I spent 45 minutes in the care of this patient today including pre-charting, chart review, review of results, face-to-face care, coordination of care and communication with consultants etc.). ? ?Return to Care: ?Return if symptoms worsen or fail to improve. ? ?Durel Salts, MD ?Pulmonary and Critical Care Medicine ?[facility] ?Office:[phone] ? ?CC: Marylynn Pearson, FNP ? ? ? ?

## 2022-01-16 NOTE — Patient Instructions (Signed)
I am happy to see you back as needed if symptoms of cough do not improve. ? ?Cough is most likely related to post nasal drip from allergic rhinitis.  ? ?Continue singulair and claritin. ? ?Continue nasal spray with nasonex or flonase.  ?Flonase - 1 spray on each side of your nose twice a day for first week, then 1 spray on each side.  ? Instructions for use: ?If you also use a saline nasal spray or rinse, use that first. ?Position the head with the chin slightly tucked. Use the right hand to spray into the left nostril and the right hand to spray into the left nostril.   Point the bottle away from the septum of your nose (cartilage that divides the two sides of your nose).  ?Hold the nostril closed on the opposite side from where you will spray ?Spray once and gently sniff to pull the medicine into the higher parts of your nose.  Don't sniff too hard as the medicine will drain down the back of your throat instead. ?Repeat with a second spray on the same side if prescribed. ?Repeat on the other side of your nose. ? ?If you cough does not continue to improve I would recommend treating you for acid reflux.  ? ? ?

## 2022-03-21 ENCOUNTER — Ambulatory Visit: Payer: Medicare HMO | Admitting: Registered"

## 2022-10-08 LAB — COLOGUARD: COLOGUARD: NEGATIVE

## 2023-02-05 ENCOUNTER — Ambulatory Visit (HOSPITAL_COMMUNITY)
Admission: EM | Admit: 2023-02-05 | Discharge: 2023-02-05 | Disposition: A | Discharge status: Home / Self Care | Payer: Medicare HMO | Attending: Sports Medicine | Admitting: Sports Medicine

## 2023-02-05 ENCOUNTER — Encounter (HOSPITAL_COMMUNITY): Payer: Self-pay | Admitting: *Deleted

## 2023-02-05 DIAGNOSIS — Z794 Long term (current) use of insulin: Secondary | ICD-10-CM | POA: Diagnosis not present

## 2023-02-05 DIAGNOSIS — E1165 Type 2 diabetes mellitus with hyperglycemia: Secondary | ICD-10-CM | POA: Diagnosis not present

## 2023-02-05 DIAGNOSIS — Z760 Encounter for issue of repeat prescription: Secondary | ICD-10-CM

## 2023-02-05 DIAGNOSIS — R739 Hyperglycemia, unspecified: Secondary | ICD-10-CM

## 2023-02-05 LAB — CBG MONITORING, ED: Glucose-Capillary: 212 mg/dL — ABNORMAL HIGH (ref 70–99)

## 2023-02-05 MED ORDER — SAFETY SEAL MISCELLANEOUS MISC: 0 refills | Status: AC

## 2023-02-05 MED ORDER — INSULIN GLARGINE 100 UNIT/ML SOLOSTAR PEN: PEN_INJECTOR | SUBCUTANEOUS | 3 refills | Status: AC

## 2023-02-05 NOTE — ED Provider Notes (Signed)
Alex Sanchez    CSN: [id number] Arrival date & time: 02/05/23  1047      History   Chief Complaint Chief Complaint  Patient presents with   Blood Sugar Problem    HPI Alex Sanchez is a 77 y.o. male.   [name] is here today with chief complaint of hyperglycemia as he is been out of his insulin.  He reports has been out of his insulin since the 22nd.  He has spoken to the pharmacy multiple times however they say they are awaiting authorization from his physician.  He saw his physician about 7 weeks ago and she provided refills however apparently his insurance is no longer covering that medication.  His blood sugar this morning was greater than 235.  He has continued his oral diabetic agents, blood pressure and cholesterol medications.  He has been concerned about his blood sugar today as he saw it increase when he was only drinking water.  He is here today requesting medications.  He denies any chest pain, shortness of breath nausea or vomiting.     Past Medical History:  Diagnosis Date   Diabetes mellitus    Enlarged prostate    Hypertension     There are no problems to display for this patient.   Past Surgical History:  Procedure Laterality Date   BACK SURGERY     CATARACT EXTRACTION Left        Home Medications    Prior to Admission medications   Medication Sig Start Date End Date Taking? Authorizing Provider  ACCU-CHEK AVIVA PLUS test strip 1 each 2 (two) times daily. 10/12/21  Yes [provider]  Accu-Chek Softclix Lancets lancets USE TO CHECK BLOOD SUGAR TWICE DAILY 10/05/21  Yes [provider]  aspirin 81 MG chewable tablet Chew 81 mg by mouth daily.   Yes [provider]  atorvastatin (LIPITOR) 20 MG tablet Take 20 mg by mouth daily.   Yes [provider]  B-D ULTRAFINE III SHORT PEN 31G X 8 MM MISC Inject 1 each into the skin 3 (three) times daily. 12/31/21  Yes [provider]  benzonatate  (TESSALON) 100 MG capsule Take 1 capsule (100 mg total) by mouth 3 (three) times daily as needed for cough ([place]). 08/31/21  Yes Gareth Morgan, MD  fluticasone (FLONASE) 50 MCG/ACT nasal spray Place 2 sprays into both nostrils daily. 11/12/21  Yes [provider]  glipiZIDE (GLUCOTROL XL) 5 MG 24 hr tablet Take 5 mg by mouth daily.     Yes [provider]  glipiZIDE (GLUCOTROL) 10 MG tablet Take 10 mg by mouth 2 (two) times daily before a meal.   Yes [provider]  insulin glargine (LANTUS) 100 UNIT/ML Solostar Pen Inject 10 units of insulin in the morning and 5 units before bedtime 02/05/23  Yes Leronda Lewers A, DO  latanoprost (XALATAN) 0.005 % ophthalmic solution 1 drop at bedtime. 12/14/21  Yes [provider]  lisinopril (ZESTRIL) 5 MG tablet Take 5 mg by mouth daily. 11/19/21  Yes [provider]  loratadine (CLARITIN) 10 MG tablet Take 1 tablet (10 mg total) by mouth daily. 11/22/21  Yes Hazel Sams, PA-C  losartan (COZAAR) 25 MG tablet Take 25 mg by mouth daily. 12/10/21  Yes [provider]  metFORMIN (GLUCOPHAGE) 1000 MG tablet Take 1,000 mg by mouth 2 (two) times daily. 12/05/21  Yes [provider]  metFORMIN (GLUCOPHAGE) 500 MG tablet Take 500-1,000 mg  by mouth 2 (two) times daily with a meal. Take 2 tablets in AM and 1 tablet in PM   Yes [provider]  montelukast (SINGULAIR) 10 MG tablet Take 10 mg by mouth daily. 12/31/21  Yes [provider]  Multiple Vitamins-Minerals (MULTIVITAMIN WITH MINERALS) tablet Take 1 tablet by mouth daily.   Yes [provider]  pioglitazone (ACTOS) 30 MG tablet Take 30 mg by mouth daily.   Yes [provider]  Safety Seal Miscellaneous MISC Please fill for Lantus Solostar pen needles 1 box 02/05/23  Yes Rodena Goldmann A, DO  simvastatin (ZOCOR) 40 MG tablet Take 40 mg by mouth daily.   Yes [provider]  tamsulosin  (FLOMAX) 0.4 MG CAPS capsule Take 0.4 mg by mouth daily. 12/11/21  Yes [provider]  mometasone (NASONEX) 50 MCG/ACT nasal spray Place 2 sprays into the nose daily. 03/27/13 03/30/13  Carmin Muskrat, MD    Family History Family History  Problem Relation Age of Onset   Asthma Neg Hx     Social History Social History   Tobacco Use   Smoking status: Never   Smokeless tobacco: Never  Vaping Use   Vaping Use: Never used  Substance Use Topics   Alcohol use: No   Drug use: No     Allergies   Patient has no known allergies.   Review of Systems Review of Systems as listed above in HPI   Physical Exam Triage Vital Signs ED Triage Vitals  Enc Vitals Group     BP 02/05/23 1211 133/72     Pulse Rate 02/05/23 1211 72     Resp 02/05/23 1211 18     Temp 02/05/23 1211 97.9 F (36.6 C)     Temp Source 02/05/23 1211 Oral     SpO2 02/05/23 1211 97 %     Weight --      Height --      Head Circumference --      Peak Flow --      Pain Score 02/05/23 1209 0     Pain Loc --      Pain Edu? --      Excl. in [place]? --    No data found.  Updated Vital Signs BP 133/72 (BP Location: Right Arm)   Pulse 72   Temp 97.9 F (36.6 C) (Oral)   Resp 18   SpO2 97%   Physical Exam Vitals reviewed.  Constitutional:      General: He is not in acute distress.    Appearance: He is normal weight. He is not ill-appearing or toxic-appearing.  HENT:     Head: Normocephalic.  Cardiovascular:     Rate and Rhythm: Normal rate.  Pulmonary:     Effort: Pulmonary effort is normal.  Skin:    General: Skin is warm.  Neurological:     General: No focal deficit present.     Mental Status: He is alert and oriented to person, place, and time.  Psychiatric:        Mood and Affect: Mood normal.        Behavior: Behavior normal.        Thought Content: Thought content normal.        Judgment: Judgment normal.      UC Treatments / Results  Labs (all labs ordered are listed, but only  abnormal results are displayed) Labs Reviewed  CBG MONITORING, ED - Abnormal; Notable for the following components:  Result Value   Glucose-Capillary 212 (*)    All other components within normal limits    EKG   Radiology No results found.  Procedures Procedures (including critical care time)  Medications Ordered in UC Medications - No data to display  Initial Impression / Assessment and Plan / UC Course  I have reviewed the triage vital signs and the nursing notes.  Pertinent labs & imaging results that were available during my care of the patient were reviewed by me and considered in my medical decision making (see chart for details).     Hyperglycemia secondary to medication shortage I spoke with the pharmacist who reports his insurance is now covering Lantus, I sent to his pharmacy prescription for Lantus.  I discussed with him starting 10 units in the morning 5 units in the evening and titrating his evening dose by 2 units until his morning glucose is less than 130.  I recommended he call make an appointment with his primary care provider soon as possible to discuss titration.  I have also provided him with information for local primary care as he was requesting information on a new primary.  Patient understands he is to pick this medication up today. Final Clinical Impressions(s) / UC Diagnoses   Final diagnoses:  Hyperglycemia  Medication refill     Discharge Instructions      I have sent to your pharmacy a prescription for Lantus which the pharmacist confirmed your insurance should cover.  Begin with 10 units in the morning and 5 units at bedtime.  2 titrate this medication increase your dose by 2 units at bedtime every 3 days while your fasting the morning blood glucose is greater than 130. I have provided the contact information for primary care group near your home.  I recommend you call them today to schedule new patient appointment if you choose.  If you  would like to keep your previous primary care provider call them today and schedule an appointment as you will need to see them for refills for medication titration.   ED Prescriptions     Medication Sig Dispense Auth. Provider   insulin glargine (LANTUS) 100 UNIT/ML Solostar Pen Inject 10 units of insulin in the morning and 5 units before bedtime 15 mL Elmore Guise, DO   Safety Seal Miscellaneous MISC Please fill for Lantus Solostar pen needles 1 box 1 Units Rodena Goldmann A, DO      PDMP not reviewed this encounter.   Rodena Goldmann A, DO 02/05/23 1401

## 2023-02-05 NOTE — ED Triage Notes (Signed)
Pt states that he has been out of insulin since 01/30/2023. He states that he has called his doctor and they dont seem to be able to do anything about it since its his insurance that isnt covering his meds. His blood sugar was 236 at 8:45am and 337 at 10am. He states he hasn't ate today and only drank water because he doesn't know what to do. He is still taking his oral meds.

## 2023-02-05 NOTE — Discharge Instructions (Signed)
I have sent to your pharmacy a prescription for Lantus which the pharmacist confirmed your insurance should cover.  Begin with 10 units in the morning and 5 units at bedtime.  2 titrate this medication increase your dose by 2 units at bedtime every 3 days while your fasting the morning blood glucose is greater than 130. I have provided the contact information for primary care group near your home.  I recommend you call them today to schedule new patient appointment if you choose.  If you would like to keep your previous primary care provider call them today and schedule an appointment as you will need to see them for refills for medication titration.

## 2023-02-10 ENCOUNTER — Ambulatory Visit (INDEPENDENT_AMBULATORY_CARE_PROVIDER_SITE_OTHER): Payer: Medicare HMO

## 2023-02-10 ENCOUNTER — Encounter (HOSPITAL_COMMUNITY): Payer: Self-pay

## 2023-02-10 ENCOUNTER — Ambulatory Visit (HOSPITAL_COMMUNITY)
Admission: EM | Admit: 2023-02-10 | Discharge: 2023-02-10 | Disposition: A | Discharge status: Home / Self Care | Payer: Medicare HMO | Attending: Family Medicine | Admitting: Family Medicine

## 2023-02-10 DIAGNOSIS — R0989 Other specified symptoms and signs involving the circulatory and respiratory systems: Secondary | ICD-10-CM | POA: Diagnosis not present

## 2023-02-10 DIAGNOSIS — J019 Acute sinusitis, unspecified: Secondary | ICD-10-CM

## 2023-02-10 DIAGNOSIS — R059 Cough, unspecified: Secondary | ICD-10-CM | POA: Diagnosis not present

## 2023-02-10 DIAGNOSIS — J069 Acute upper respiratory infection, unspecified: Secondary | ICD-10-CM

## 2023-02-10 DIAGNOSIS — R051 Acute cough: Secondary | ICD-10-CM

## 2023-02-10 MED ORDER — AMOXICILLIN 500 MG PO TABS: 500.0000 mg | ORAL_TABLET | Freq: Three times a day (TID) | ORAL | 0 refills | Status: DC

## 2023-02-10 MED ORDER — AMOXICILLIN 500 MG PO TABS: 500.0000 mg | ORAL_TABLET | Freq: Three times a day (TID) | ORAL | 0 refills | Status: AC

## 2023-02-10 MED ORDER — BENZONATATE 100 MG PO CAPS: 100.0000 mg | ORAL_CAPSULE | Freq: Three times a day (TID) | ORAL | 0 refills | Status: DC | PRN

## 2023-02-10 NOTE — ED Provider Notes (Addendum)
[place]    CSN: [id number] Arrival date & time: 02/10/23  1036      History   Chief Complaint Chief Complaint  Patient presents with   Cough    HPI Alex Sanchez is a 77 y.o. male.    Cough  Here for cough and congestion.  Symptoms began over a week ago, maybe 8 to 10 days ago.  Last night he felt like he began coughing more persistently and has maybe felt tight in his chest.  He has a hard time describing the feeling in his chest.  No fever or chills.  No diarrhea.  No vomiting  He does have a history of diabetes and sugars have been in the 120s  Past Medical History:  Diagnosis Date   Diabetes mellitus    Enlarged prostate    Hypertension     There are no problems to display for this patient.   Past Surgical History:  Procedure Laterality Date   BACK SURGERY     CATARACT EXTRACTION Left        Home Medications    Prior to Admission medications   Medication Sig Start Date End Date Taking? Authorizing Provider  amoxicillin (AMOXIL) 500 MG tablet Take 1 tablet (500 mg total) by mouth in the morning, at noon, and at bedtime for 7 days. 02/10/23 02/17/23 Yes Ibtisam Benge, Gwenlyn Perking, MD  benzonatate (TESSALON) 100 MG capsule Take 1 capsule (100 mg total) by mouth 3 (three) times daily as needed for cough. 02/10/23  Yes Barrett Henle, MD  ACCU-CHEK AVIVA PLUS test strip 1 each 2 (two) times daily. 10/12/21   [provider]  Accu-Chek Softclix Lancets lancets USE TO CHECK BLOOD SUGAR TWICE DAILY 10/05/21   [provider]  aspirin 81 MG chewable tablet Chew 81 mg by mouth daily.    [provider]  atorvastatin (LIPITOR) 20 MG tablet Take 20 mg by mouth daily.    [provider]  B-D ULTRAFINE III SHORT PEN 31G X 8 MM MISC Inject 1 each into the skin 3 (three) times daily. 12/31/21   [provider]  fluticasone (FLONASE) 50 MCG/ACT nasal spray Place 2 sprays into both nostrils daily. 11/12/21   [provider]  glipiZIDE (GLUCOTROL XL) 5 MG 24 hr tablet Take 5 mg by mouth daily.      [provider]  glipiZIDE (GLUCOTROL) 10 MG tablet Take 10 mg by mouth 2 (two) times daily before a meal.    [provider]  insulin glargine (LANTUS) 100 UNIT/ML Solostar Pen Inject 10 units of insulin in the morning and 5 units before bedtime 02/05/23   Rodena Goldmann A, DO  latanoprost (XALATAN) 0.005 % ophthalmic solution 1 drop at bedtime. 12/14/21   [provider]  lisinopril (ZESTRIL) 5 MG tablet Take 5 mg by mouth daily. 11/19/21   [provider]  loratadine (CLARITIN) 10 MG tablet Take 1 tablet (10 mg total) by mouth daily. 11/22/21   Hazel Sams, PA-C  losartan (COZAAR) 25 MG tablet Take 25 mg by mouth daily. 12/10/21   [provider]  metFORMIN (GLUCOPHAGE) 1000 MG tablet Take 1,000 mg by mouth 2 (two) times daily. 12/05/21   [provider]  metFORMIN (GLUCOPHAGE) 500 MG tablet Take 500-1,000 mg by mouth 2 (two) times daily with a meal. Take 2 tablets in AM and 1 tablet in PM    [provider]  mometasone (NASONEX) 50 MCG/ACT nasal spray Place  2 sprays into the nose daily. 03/27/13 03/30/13  Carmin Muskrat, MD  montelukast (SINGULAIR) 10 MG tablet Take 10 mg by mouth daily. 12/31/21   [provider]  Multiple Vitamins-Minerals (MULTIVITAMIN WITH MINERALS) tablet Take 1 tablet by mouth daily.    [provider]  pioglitazone (ACTOS) 30 MG tablet Take 30 mg by mouth daily.    [provider]  Safety Seal Miscellaneous MISC Please fill for Lantus Solostar pen needles 1 box 02/05/23   Rodena Goldmann A, DO  simvastatin (ZOCOR) 40 MG tablet Take 40 mg by mouth daily.    [provider]  tamsulosin (FLOMAX) 0.4 MG CAPS capsule Take 0.4 mg by mouth daily. 12/11/21   [provider]    Family History Family History  Problem Relation Age of Onset   Asthma Neg Hx     Social History Social History    Tobacco Use   Smoking status: Never   Smokeless tobacco: Never  Vaping Use   Vaping Use: Never used  Substance Use Topics   Alcohol use: No   Drug use: No     Allergies   Patient has no known allergies.   Review of Systems Review of Systems  Respiratory:  Positive for cough.      Physical Exam Triage Vital Signs ED Triage Vitals  Enc Vitals Group     BP 02/10/23 1146 (!) 160/91     Pulse Rate 02/10/23 1146 87     Resp 02/10/23 1146 16     Temp 02/10/23 1146 97.9 F (36.6 C)     Temp Source 02/10/23 1146 Oral     SpO2 02/10/23 1146 94 %     Weight --      Height --      Head Circumference --      Peak Flow --      Pain Score 02/10/23 1147 0     Pain Loc --      Pain Edu? --      Excl. in [place]? --    No data found.  Updated Vital Signs BP (!) 160/91 (BP Location: Left Arm)   Pulse 87   Temp 97.9 F (36.6 C) (Oral)   Resp 16   SpO2 94%   Visual Acuity Right Eye Distance:   Left Eye Distance:   Bilateral Distance:    Right Eye Near:   Left Eye Near:    Bilateral Near:     Physical Exam Vitals reviewed.  Constitutional:      General: He is not in acute distress.    Appearance: He is not toxic-appearing.  HENT:     Nose: Congestion present.     Mouth/Throat:     Mouth: Mucous membranes are moist.     Pharynx: No oropharyngeal exudate or posterior oropharyngeal erythema.  Eyes:     Extraocular Movements: Extraocular movements intact.     Conjunctiva/sclera: Conjunctivae normal.     Pupils: Pupils are equal, round, and reactive to light.  Cardiovascular:     Rate and Rhythm: Normal rate and regular rhythm.     Heart sounds: No murmur heard. Pulmonary:     Effort: No respiratory distress.     Breath sounds: No stridor. No wheezing or rhonchi.     Comments: Air movement but I did hear a crackle or 2 in the upper lung fields.  No wheezing or rhonchi Musculoskeletal:     Cervical back: Neck supple.  Lymphadenopathy:     Cervical:  No cervical  adenopathy.  Skin:    Capillary Refill: Capillary refill takes less than 2 seconds.     Coloration: Skin is not jaundiced or pale.  Neurological:     General: No focal deficit present.     Mental Status: He is alert and oriented to person, place, and time.  Psychiatric:        Behavior: Behavior normal.      UC Treatments / Results  Labs (all labs ordered are listed, but only abnormal results are displayed) Labs Reviewed - No data to display  EKG   Radiology DG Chest 2 View  Result Date: 02/10/2023 CLINICAL DATA:  Cough and congestion for 8-10 days. Tightness in chest for 1 day. EXAM: CHEST - 2 VIEW COMPARISON:  Chest radiographs 01/01/2022 FINDINGS: Cardiac silhouette and mediastinal contours are within limits. Mild left basilar horizontal linear scarring is unchanged. No acute airspace opacity. No pleural effusion or pneumothorax. Minimal dextrocurvature of the midthoracic spine. Mild multilevel degenerative disc changes of the thoracic spine. Chronic left 4th rib attenuation and deformity, possibly developmental and unchanged from prior. IMPRESSION: No acute cardiopulmonary disease process. Electronically Signed   By: Yvonne Kendall M.D.   On: 02/10/2023 12:16    Procedures Procedures (including critical care time)  Medications Ordered in UC Medications - No data to display  Initial Impression / Assessment and Plan / UC Course  I have reviewed the triage vital signs and the nursing notes.  Pertinent labs & imaging results that were available during my care of the patient were reviewed by me and considered in my medical decision making (see chart for details).        CXR does not show infiltrate or fluid.  Amoxil is sent for acute sinusitis, since he has had more congestion in last 24 hours, and symptoms over 7 days. Tessalon perles are sent in for cough. Without a clear indication, I am not going to do any treatment with steroids, since he has DM also  Initially rx's  were sent to [facility] on [address], but pt had requested them sent elsewhere. [facility]'s [facility] [address], and VM [facility] Final Clinical Impressions(s) / UC Diagnoses   Final diagnoses:  Viral URI with cough  Acute cough  Acute sinusitis, recurrence not specified, unspecified location     Discharge Instructions      Your chest xray did not show any pneumonia  Take amoxicillin 500 mg--1 tab 3 times daily for 7 days  Take benzonatate 100 mg, 1 tab every 8 hours as needed for cough.        ED Prescriptions     Medication Sig Dispense Auth. Provider   amoxicillin (AMOXIL) 500 MG tablet Take 1 tablet (500 mg total) by mouth in the morning, at noon, and at bedtime for 7 days. 21 tablet Darry Kelnhofer, Gwenlyn Perking, MD   benzonatate (TESSALON) 100 MG capsule Take 1 capsule (100 mg total) by mouth 3 (three) times daily as needed for cough. 21 capsule Barrett Henle, MD      PDMP not reviewed this encounter.   Barrett Henle, MD 02/10/23 1229    Barrett Henle, MD 02/10/23 [phone]

## 2023-02-10 NOTE — Discharge Instructions (Signed)
Your chest xray did not show any pneumonia  Take amoxicillin 500 mg--1 tab 3 times daily for 7 days  Take benzonatate 100 mg, 1 tab every 8 hours as needed for cough.

## 2023-02-10 NOTE — ED Triage Notes (Signed)
Patient reports nasal congestion, sneezing, and a non productive cough x 1 week and worse last night.  Patient states he has been using Coricidin and Tylenol ES and the last dose was at 0600 today.

## 2023-05-29 ENCOUNTER — Encounter: Payer: Self-pay | Admitting: Radiation Oncology

## 2023-05-29 NOTE — Progress Notes (Signed)
GU Location of Tumor / Histology: Prostate Ca  If Prostate Cancer, Gleason Score is (3 + 4) and PSA is (5.8 on 01/27/2023)  Biopsies      Past/Anticipated interventions by urology, if any: NA  Past/Anticipated interventions by medical oncology, if any: NA  Weight changes, if any: No  IPSS:  16 SHIM: 9  Bowel/Bladder complaints, if any:  Urinary frequency and nocturia.  No bowel issues.  Nausea/Vomiting, if any: Yes vomit shortly after eating [place] bacon.  Pain issues, if any:  Mild discomfort to right side 4-5/10 possibly from shingles 02/2023  SAFETY ISSUES: Prior radiation? No Pacemaker/ICD? No Possible current pregnancy? Male Is the patient on methotrexate? No  Current Complaints / other details:

## 2023-06-04 ENCOUNTER — Encounter: Payer: Self-pay | Admitting: Urology

## 2023-06-04 DIAGNOSIS — C61 Malignant neoplasm of prostate: Secondary | ICD-10-CM | POA: Insufficient documentation

## 2023-06-05 ENCOUNTER — Encounter: Payer: Self-pay | Admitting: Radiation Oncology

## 2023-06-05 ENCOUNTER — Ambulatory Visit
Admission: RE | Admit: 2023-06-05 | Discharge: 2023-06-05 | Disposition: A | Discharge status: Home / Self Care | Payer: Medicare HMO | Source: Ambulatory Visit | Attending: Radiation Oncology | Admitting: Radiation Oncology

## 2023-06-05 ENCOUNTER — Other Ambulatory Visit: Payer: Self-pay

## 2023-06-05 VITALS — BP 140/70 | HR 84 | Temp 96.4°F | Resp 18 | Ht 72.0 in | Wt 193.0 lb

## 2023-06-05 DIAGNOSIS — M129 Arthropathy, unspecified: Secondary | ICD-10-CM | POA: Insufficient documentation

## 2023-06-05 DIAGNOSIS — E119 Type 2 diabetes mellitus without complications: Secondary | ICD-10-CM | POA: Diagnosis not present

## 2023-06-05 DIAGNOSIS — Z79899 Other long term (current) drug therapy: Secondary | ICD-10-CM | POA: Diagnosis not present

## 2023-06-05 DIAGNOSIS — C61 Malignant neoplasm of prostate: Secondary | ICD-10-CM | POA: Diagnosis present

## 2023-06-05 DIAGNOSIS — Z7984 Long term (current) use of oral hypoglycemic drugs: Secondary | ICD-10-CM | POA: Insufficient documentation

## 2023-06-05 DIAGNOSIS — Z7982 Long term (current) use of aspirin: Secondary | ICD-10-CM | POA: Diagnosis not present

## 2023-06-05 DIAGNOSIS — I10 Essential (primary) hypertension: Secondary | ICD-10-CM | POA: Diagnosis not present

## 2023-06-05 DIAGNOSIS — Z7951 Long term (current) use of inhaled steroids: Secondary | ICD-10-CM | POA: Insufficient documentation

## 2023-06-05 DIAGNOSIS — Z794 Long term (current) use of insulin: Secondary | ICD-10-CM | POA: Diagnosis not present

## 2023-06-05 HISTORY — DX: Elevated prostate specific antigen (PSA): R97.20

## 2023-06-05 HISTORY — DX: Nocturia: R35.1

## 2023-06-05 HISTORY — DX: Unspecified osteoarthritis, unspecified site: M19.90

## 2023-06-05 HISTORY — DX: Poor urinary stream: R39.12

## 2023-06-05 HISTORY — DX: Unspecified glaucoma: H40.9

## 2023-06-05 NOTE — Progress Notes (Signed)
Introduced myself to the patient as the prostate nurse navigator.  No barriers to care identified at this time.  He is here to discuss his radiation treatment options, and will take some additional time to consider all options.  I gave him my business card and asked him to call me with questions or concerns.  Verbalized understanding.

## 2023-06-05 NOTE — Progress Notes (Signed)
Radiation Oncology         (336) [phone] ________________________________  Initial Outpatient Consultation  Name: Alex Sanchez MRN: [id number]  Date: 06/05/2023  DOB: [birth year]  CC:Marylynn Pearson, FNP  Bjorn Pippin, MD   REFERRING PHYSICIAN: Bjorn Pippin, MD  DIAGNOSIS: 77 y.o. gentleman with Stage T2a adenocarcinoma of the prostate with Gleason score of 3+4, and PSA of 5.8.    ICD-10-CM   1. Malignant neoplasm of prostate (HCC)  C61       HISTORY OF PRESENT ILLNESS: Alex Sanchez is a 77 y.o. male with a diagnosis of prostate cancer. He has a h/o BPH with BOO, managed with Flomax and was noted to have an elevated PSA of 6.1 on routine labs by his primary care provider, Marylynn Pearson, NP in 10/2022.  Accordingly, he was referred for evaluation in urology by Dr. Annabell Howells on 01/26/23,  digital rectal examination performed at that time showed an 8 mm right apex prostate nodule. A repeat PSA obtained that day showed persistent elevation at 5.8. The patient proceeded to transrectal ultrasound with 12 biopsies of the prostate on 03/18/23.  The prostate volume measured 59.45 cc.  Out of 12 core biopsies, 2 were positive.  The maximum Gleason score was 3+4, and this was seen in the right apex and right apex lateral. He was started on Finasteride 03/27/23 to help improve his persistent LUTS despite Flomax, prior to treatment of his prostate cancer.  The patient reviewed the biopsy results with his urologist and he has kindly been referred today for discussion of potential radiation treatment options.   PREVIOUS RADIATION THERAPY: No  PAST MEDICAL HISTORY:  Past Medical History:  Diagnosis Date   Arthritis    Diabetes mellitus    Elevated PSA    Enlarged prostate    Glaucoma    Hypertension    Nocturia    Weak urinary stream       PAST SURGICAL HISTORY: Past Surgical History:  Procedure Laterality Date   BACK SURGERY     CATARACT EXTRACTION Left    PROSTATE BIOPSY      FAMILY  HISTORY:  Family History  Problem Relation Age of Onset   Asthma Neg Hx     SOCIAL HISTORY:  Social History   Socioeconomic History   Marital status: Married    Spouse name: Not on file   Number of children: Not on file   Years of education: Not on file   Highest education level: Not on file  Occupational History   Not on file  Tobacco Use   Smoking status: Never   Smokeless tobacco: Never  Vaping Use   Vaping status: Never Used  Substance and Sexual Activity   Alcohol use: No   Drug use: No   Sexual activity: Yes    Birth control/protection: None  Other Topics Concern   Not on file  Social History Narrative   Not on file   Social Determinants of Health   Financial Resource Strain: Not on file  Food Insecurity: Food Insecurity Present (06/05/2023)   Hunger Vital Sign    Worried About Running Out of Food in the Last Year: Sometimes true    Ran Out of Food in the Last Year: Often true  Transportation Needs: No Transportation Needs (06/05/2023)   PRAPARE - Administrator, Civil Service (Medical): No    Lack of Transportation (Non-Medical): No  Physical Activity: Not on file  Stress: Not on file  Social Connections: Unknown (  02/24/2023)   Received from [facility]   Social Network    Social Network: Not on file  Intimate Partner Violence: Not At Risk (06/05/2023)   Humiliation, Afraid, Rape, and Kick questionnaire    Fear of Current or Ex-Partner: No    Emotionally Abused: No    Physically Abused: No    Sexually Abused: No    ALLERGIES: Codeine  MEDICATIONS:  Current Outpatient Medications  Medication Sig Dispense Refill   ACCU-CHEK AVIVA PLUS test strip 1 each 2 (two) times daily.     Accu-Chek Softclix Lancets lancets USE TO CHECK BLOOD SUGAR TWICE DAILY     aspirin 81 MG chewable tablet Chew 81 mg by mouth daily.     atorvastatin (LIPITOR) 20 MG tablet Take 20 mg by mouth daily.     B-D ULTRAFINE III SHORT PEN 31G X 8 MM MISC  Inject 1 each into the skin 3 (three) times daily.     cholecalciferol (VITAMIN D3) 25 MCG (1000 UNIT) tablet Take 1,000 Units by mouth daily.     Continuous Glucose Receiver (FREESTYLE LIBRE 3 READER) DEVI      Continuous Glucose Sensor (FREESTYLE LIBRE 3 SENSOR) MISC      dorzolamide-timolol (COSOPT) 2-0.5 % ophthalmic solution Place 1 drop into both eyes 2 (two) times daily.     fluticasone (FLONASE) 50 MCG/ACT nasal spray Place 2 sprays into both nostrils daily.     glipiZIDE (GLUCOTROL XL) 5 MG 24 hr tablet Take 5 mg by mouth daily.       insulin glargine (LANTUS) 100 UNIT/ML Solostar Pen Inject 10 units of insulin in the morning and 5 units before bedtime 15 mL 3   lisinopril (ZESTRIL) 5 MG tablet Take 5 mg by mouth daily.     losartan (COZAAR) 25 MG tablet Take 25 mg by mouth daily.     metFORMIN (GLUCOPHAGE) 1000 MG tablet Take 1,000 mg by mouth 2 (two) times daily.     montelukast (SINGULAIR) 10 MG tablet Take 10 mg by mouth daily.     Multiple Vitamins-Minerals (MULTIVITAMIN WITH MINERALS) tablet Take 1 tablet by mouth daily.     pioglitazone (ACTOS) 30 MG tablet Take 30 mg by mouth daily.     simvastatin (ZOCOR) 40 MG tablet Take 40 mg by mouth daily.     tamsulosin (FLOMAX) 0.4 MG CAPS capsule Take 0.4 mg by mouth daily.     finasteride (PROSCAR) 5 MG tablet Take 1 tablet by mouth daily.     insulin degludec (TRESIBA FLEXTOUCH) 100 UNIT/ML FlexTouch Pen      latanoprost (XALATAN) 0.005 % ophthalmic solution 1 drop at bedtime. (Patient not taking: Reported on 06/05/2023)     loratadine (CLARITIN) 10 MG tablet Take 1 tablet (10 mg total) by mouth daily. (Patient not taking: Reported on 06/05/2023) 30 tablet 2   mometasone (NASONEX) 50 MCG/ACT nasal spray Place 2 sprays into the nose daily. 17 g 0   Safety Seal Miscellaneous MISC Please fill for Lantus Solostar pen needles 1 box 1 Units 0   triamcinolone cream (KENALOG) 0.1 % Apply to affected area twice a day as needed for rash     No  current facility-administered medications for this encounter.    REVIEW OF SYSTEMS:  On review of systems, the patient reports that he is doing well overall. He denies any chest pain, shortness of breath, cough, fevers, chills, night sweats, unintended weight changes. He denies any bowel disturbances, and denies abdominal pain  or nausea. He reports an instance of vomiting after eating [place] bacon. He also reports mild discomfort to his right side, rated 4-5/10, felt to possibly be related to his recent shingles infection in 02/2023. His IPSS was 16, indicating moderate urinary symptoms despite taking Flomax daily as prescribed. He has recently started taking Finasteride 03/27/23 and has not noted significant improvement as of yet. He specifically reports urinary frequency and nocturia. His SHIM was 9, indicating he has moderate erectile dysfunction. A complete review of systems is obtained and is otherwise negative.    PHYSICAL EXAM:  Wt Readings from Last 3 Encounters:  06/05/23 193 lb (87.5 kg)  01/16/22 182 lb 6.4 oz (82.7 kg)  01/01/22 170 lb (77.1 kg)   Temp Readings from Last 3 Encounters:  06/05/23 (!) 96.4 F (35.8 C) (Temporal)  02/10/23 97.9 F (36.6 C) (Oral)  02/05/23 97.9 F (36.6 C) (Oral)   BP Readings from Last 3 Encounters:  06/05/23 (!) 140/70  02/10/23 (!) 160/91  02/05/23 133/72   Pulse Readings from Last 3 Encounters:  06/05/23 84  02/10/23 87  02/05/23 72    /10  In general this is a well appearing African American man in no acute distress. He's alert and oriented x4 and appropriate throughout the examination. Cardiopulmonary assessment is negative for acute distress, and he exhibits normal effort.     KPS = 100  100 - Normal; no complaints; no evidence of disease. 90   - Able to carry on normal activity; minor signs or symptoms of disease. 80   - Normal activity with effort; some signs or symptoms of disease. 108   - Cares for self; unable to carry on  normal activity or to do active work. 60   - Requires occasional assistance, but is able to care for most of his personal needs. 50   - Requires considerable assistance and frequent medical care. 40   - Disabled; requires special care and assistance. 30   - Severely disabled; hospital admission is indicated although death not imminent. 20   - Very sick; hospital admission necessary; active supportive treatment necessary. 10   - Moribund; fatal processes progressing rapidly. 0     - Dead  Karnofsky DA, Abelmann WH, Craver LS and Burchenal [facility] [phone]) The use of the nitrogen mustards in the palliative treatment of carcinoma: with particular reference to bronchogenic carcinoma Cancer 1 634-56  LABORATORY DATA:  Lab Results  Component Value Date   WBC 4.2 01/01/2022   HGB 12.5 (L) 01/01/2022   HCT 41.8 01/01/2022   MCV 93.9 01/01/2022   PLT 238 01/01/2022   Lab Results  Component Value Date   NA 137 01/01/2022   K 4.2 01/01/2022   CL 101 01/01/2022   CO2 30 01/01/2022   Lab Results  Component Value Date   ALT 16 01/01/2022   AST 27 01/01/2022   ALKPHOS 59 01/01/2022   BILITOT 0.3 01/01/2022     RADIOGRAPHY: No results found.    IMPRESSION/PLAN: 1. 77 y.o. gentleman with Stage T2a adenocarcinoma of the prostate with Gleason Score of 3+4, and PSA of 5.8. We discussed the patient's workup and outlined the nature of prostate cancer in this setting. The patient's T stage, Gleason's score, and PSA put him into the favorable intermediate risk group. Accordingly, he is eligible for a variety of potential treatment options including brachytherapy, 5.5 weeks of external radiation, or prostatectomy. We discussed the available radiation techniques, and focused on the details and logistics  of delivery. We discussed and outlined the risks, benefits, short and long-term effects associated with radiotherapy and compared and contrasted these with prostatectomy. We discussed the role of SpaceOAR gel  in reducing the rectal toxicity associated with radiotherapy. He appears to have a good understanding of his disease and our treatment recommendations which are of curative intent.  He was encouraged to ask questions that were answered to his stated satisfaction.  At the conclusion of our conversation, the patient is interested in moving forward with brachytherapy and use of SpaceOAR gel to reduce rectal toxicity from radiotherapy.  We will share our discussion with Dr. Annabell Howells and move forward with scheduling his [facility] planning appointment in the near future.  The patient met briefly with Darryl Nestle in our office who will be working closely with him to coordinate OR scheduling and pre and post procedure appointments.  We will contact the pharmaceutical rep to ensure that SpaceOAR is available at the time of procedure.  We enjoyed meeting him today and look forward to continuing to participate in his care.  We personally spent 70 minutes in this encounter including chart review, reviewing radiological studies, meeting face-to-face with the patient, entering orders and completing documentation.    Marguarite Arbour, PA-C    Margaretmary Dys, MD  [name]  Radiation Oncology Direct Dial: [phone]  Fax: [phone] [email or web address]  Skype  [email or web address]   This document serves as a record of services personally performed by Margaretmary Dys, MD and Marcello Fennel, PA-C. It was created on their behalf by Mickie Bail, a trained medical scribe. The creation of this record is based on the scribe's personal observations and the provider's statements to them. This document has been checked and approved by the attending provider.

## 2023-06-10 ENCOUNTER — Telehealth: Payer: Self-pay | Admitting: *Deleted

## 2023-06-10 ENCOUNTER — Other Ambulatory Visit: Payer: Self-pay | Admitting: Urology

## 2023-06-10 NOTE — Telephone Encounter (Signed)
CALLED PATIENT TO INFORM OF PRE-SEED APPTS. FOR 07-23-23 AND HIS IMPLANT DATE FOR 08-14-23 @ 7:30 AM, SPOKE WITH PATIENT AND HE IS AWARE OF THESE APPTS.

## 2023-06-10 NOTE — Telephone Encounter (Signed)
XXXX 

## 2023-07-21 ENCOUNTER — Telehealth: Payer: Self-pay | Admitting: *Deleted

## 2023-07-21 NOTE — Telephone Encounter (Signed)
CALLED PATIENT TO REMIND OF PRE-SEED APPTS. FOR 07-23-23, SPOKE WITH PATIENT AND HE IS AWARE OF THESE APPTS.

## 2023-07-22 NOTE — Progress Notes (Signed)
Radiation Oncology         (336) [phone] ________________________________  Name: SOLLIE TIPPERY MRN: [id number]  Date: 07/23/2023  DOB: [birth year]  SIMULATION AND TREATMENT PLANNING NOTE PUBIC ARCH STUDY  GM:WNUUVOZD, Cassell Clement, FNP  Bjorn Pippin, MD  DIAGNOSIS: 77 y.o. gentleman with Stage T2a adenocarcinoma of the prostate with Gleason score of 3+4, and PSA of 5.8.   Oncology History  Malignant neoplasm of prostate (HCC)  03/18/2023 Cancer Staging   Staging form: Prostate, AJCC 8th Edition - Clinical stage from 03/18/2023: Stage IIB (cT2a, cN0, cM0, PSA: 5.8, Grade Group: 2) - Signed by Marcello Fennel, PA-C on 06/04/2023 Histopathologic type: Adenocarcinoma, NOS Stage prefix: Initial diagnosis Prostate specific antigen (PSA) range: Less than 10 Gleason primary pattern: 3 Gleason secondary pattern: 4 Gleason score: 7 Histologic grading system: 5 grade system Number of biopsy cores examined: 12 Number of biopsy cores positive: 2 Location of positive needle core biopsies: One side   06/04/2023 Initial Diagnosis   Malignant neoplasm of prostate (HCC)       ICD-10-CM   1. Malignant neoplasm of prostate (HCC)  C61       COMPLEX SIMULATION:  The patient presented today for evaluation for possible prostate seed implant. He was brought to the radiation planning suite and placed supine on the CT couch. A 3-dimensional image study set was obtained in upload to the planning computer. There, on each axial slice, I contoured the prostate gland. Then, using three-dimensional radiation planning tools I reconstructed the prostate in view of the structures from the transperineal needle pathway to assess for possible pubic arch interference. In doing so, I did not appreciate any pubic arch interference. Also, the patient's prostate volume was estimated based on the drawn structure. The volume was 45 cc.  Given the pubic arch appearance and prostate volume, patient remains a good candidate to proceed  with prostate seed implant. Today, he freely provided informed written consent to proceed.    PLAN: The patient will undergo prostate seed implant.   ________________________________  Artist Pais. Kathrynn Running, M.D.

## 2023-07-22 NOTE — Progress Notes (Signed)
Radiation Oncology         [phone] ________________________________  Outpatient Follow up- Pre-seed visit  Name: Alex Sanchez MRN: [id number]  Date: 07/23/2023  DOB: 1945/11/30  CC:Marylynn Pearson, FNP  Alex Pippin, MD   REFERRING PHYSICIAN: Bjorn Pippin, MD  DIAGNOSIS: 77 y.o. gentleman with Stage T2a adenocarcinoma of the prostate with Gleason score of 3+4, and PSA of 5.8.     ICD-10-CM   1. Malignant neoplasm of prostate (HCC)  C61       HISTORY OF PRESENT ILLNESS: Alex Sanchez is a 77 y.o. male with a diagnosis of prostate cancer. He has a h/o BPH with BOO, managed with Flomax and was noted to have an elevated PSA of 6.1 on routine labs by his primary care provider, Marylynn Pearson, NP in 10/2022.  Accordingly, he was referred for evaluation in urology by Dr. Annabell Howells on 01/26/23,  digital rectal examination performed at that time showed an 8 mm right apex prostate nodule. A repeat PSA obtained that day showed persistent elevation at 5.8. The patient proceeded to transrectal ultrasound with 12 biopsies of the prostate on 03/18/23.  The prostate volume measured 59.45 cc.  Out of 12 core biopsies, 2 were positive.  The maximum Gleason score was 3+4, and this was seen in the right apex and right apex lateral. He was started on Finasteride 03/27/23 to help improve his persistent LUTS despite Flomax, prior to treatment of his prostate cancer.   The patient reviewed the biopsy results with his urologist and was kindly referred to [place] for discussion of potential radiation treatment options. We initially met the patient on 06/05/23 and he was most interested in proceeding with brachytherapy and SpaceOAR gel placement for treatment of his disease. He is here today for his pre-procedure imaging for planning and to answer any additional questions he may have about this treatment.   PREVIOUS RADIATION THERAPY: No  PAST MEDICAL HISTORY:  Past Medical History:  Diagnosis Date   Arthritis     Diabetes mellitus    Elevated PSA    Enlarged prostate    Glaucoma    Hypertension    Nocturia    Weak urinary stream       PAST SURGICAL HISTORY: Past Surgical History:  Procedure Laterality Date   BACK SURGERY     CATARACT EXTRACTION Left    PROSTATE BIOPSY      FAMILY HISTORY:  Family History  Problem Relation Age of Onset   Asthma Neg Hx     SOCIAL HISTORY:  Social History   Socioeconomic History   Marital status: Married    Spouse name: Not on file   Number of children: Not on file   Years of education: Not on file   Highest education level: Not on file  Occupational History   Not on file  Tobacco Use   Smoking status: Never   Smokeless tobacco: Never  Vaping Use   Vaping status: Never Used  Substance and Sexual Activity   Alcohol use: No   Drug use: No   Sexual activity: Yes    Birth control/protection: None  Other Topics Concern   Not on file  Social History Narrative   Not on file   Social Determinants of Health   Financial Resource Strain: Not on file  Food Insecurity: Food Insecurity Present (06/05/2023)   Hunger Vital Sign    Worried About Running Out of Food in the Last Year: Sometimes true    Ran Out of  Food in the Last Year: Often true  Transportation Needs: No Transportation Needs (06/05/2023)   PRAPARE - Administrator, Civil Service (Medical): No    Lack of Transportation (Non-Medical): No  Physical Activity: Not on file  Stress: Not on file  Social Connections: Unknown (02/24/2023)   Received from [name], [facility]   Social Network    Social Network: Not on file  Intimate Partner Violence: Not At Risk (06/05/2023)   Humiliation, Afraid, Rape, and Kick questionnaire    Fear of Current or Ex-Partner: No    Emotionally Abused: No    Physically Abused: No    Sexually Abused: No    ALLERGIES: Codeine  MEDICATIONS:  Current Outpatient Medications  Medication Sig Dispense Refill   ACCU-CHEK AVIVA PLUS test  strip 1 each 2 (two) times daily.     Accu-Chek Softclix Lancets lancets USE TO CHECK BLOOD SUGAR TWICE DAILY     aspirin 81 MG chewable tablet Chew 81 mg by mouth daily.     atorvastatin (LIPITOR) 20 MG tablet Take 20 mg by mouth daily.     B-D ULTRAFINE III SHORT PEN 31G X 8 MM MISC Inject 1 each into the skin 3 (three) times daily.     cholecalciferol (VITAMIN D3) 25 MCG (1000 UNIT) tablet Take 1,000 Units by mouth daily.     Continuous Glucose Receiver (FREESTYLE LIBRE 3 READER) DEVI      Continuous Glucose Sensor (FREESTYLE LIBRE 3 SENSOR) MISC      dorzolamide-timolol (COSOPT) 2-0.5 % ophthalmic solution Place 1 drop into both eyes 2 (two) times daily.     finasteride (PROSCAR) 5 MG tablet Take 1 tablet by mouth daily.     fluticasone (FLONASE) 50 MCG/ACT nasal spray Place 2 sprays into both nostrils daily.     glipiZIDE (GLUCOTROL XL) 5 MG 24 hr tablet Take 5 mg by mouth daily.       insulin degludec (TRESIBA FLEXTOUCH) 100 UNIT/ML FlexTouch Pen      insulin glargine (LANTUS) 100 UNIT/ML Solostar Pen Inject 10 units of insulin in the morning and 5 units before bedtime 15 mL 3   latanoprost (XALATAN) 0.005 % ophthalmic solution 1 drop at bedtime. (Patient not taking: Reported on 06/05/2023)     lisinopril (ZESTRIL) 5 MG tablet Take 5 mg by mouth daily.     loratadine (CLARITIN) 10 MG tablet Take 1 tablet (10 mg total) by mouth daily. (Patient not taking: Reported on 06/05/2023) 30 tablet 2   losartan (COZAAR) 25 MG tablet Take 25 mg by mouth daily.     metFORMIN (GLUCOPHAGE) 1000 MG tablet Take 1,000 mg by mouth 2 (two) times daily.     mometasone (NASONEX) 50 MCG/ACT nasal spray Place 2 sprays into the nose daily. 17 g 0   montelukast (SINGULAIR) 10 MG tablet Take 10 mg by mouth daily.     Multiple Vitamins-Minerals (MULTIVITAMIN WITH MINERALS) tablet Take 1 tablet by mouth daily.     pioglitazone (ACTOS) 30 MG tablet Take 30 mg by mouth daily.     Safety Seal Miscellaneous MISC Please  fill for Lantus Solostar pen needles 1 box 1 Units 0   simvastatin (ZOCOR) 40 MG tablet Take 40 mg by mouth daily.     tamsulosin (FLOMAX) 0.4 MG CAPS capsule Take 0.4 mg by mouth daily.     triamcinolone cream (KENALOG) 0.1 % Apply to affected area twice a day as needed for rash     No current  facility-administered medications for this visit.    REVIEW OF SYSTEMS:  On review of systems, the patient reports that he is doing well overall. He denies any chest pain, shortness of breath, cough, fevers, chills, night sweats, unintended weight changes. He denies any bowel disturbances, and denies abdominal pain or nausea. He reports an instance of vomiting after eating [place] bacon. He also reports mild discomfort to his right side, rated 4-5/10, felt to possibly be related to his recent shingles infection in 02/2023. His IPSS was 16, indicating moderate urinary symptoms despite taking Flomax daily as prescribed. He has recently started taking Finasteride 03/27/23 in addition to the tamsulosin. He specifically reports urinary frequency and nocturia. His SHIM was 9, indicating he has moderate erectile dysfunction. A complete review of systems is obtained and is otherwise negative.     PHYSICAL EXAM:  Wt Readings from Last 3 Encounters:  06/05/23 193 lb (87.5 kg)  01/16/22 182 lb 6.4 oz (82.7 kg)  01/01/22 170 lb (77.1 kg)   Temp Readings from Last 3 Encounters:  06/05/23 (!) 96.4 F (35.8 C) (Temporal)  02/10/23 97.9 F (36.6 C) (Oral)  02/05/23 97.9 F (36.6 C) (Oral)   BP Readings from Last 3 Encounters:  06/05/23 (!) 140/70  02/10/23 (!) 160/91  02/05/23 133/72   Pulse Readings from Last 3 Encounters:  06/05/23 84  02/10/23 87  02/05/23 72    /10  In general this is a well appearing African American male in no acute distress. He's alert and oriented x4 and appropriate throughout the examination. Cardiopulmonary assessment is negative for acute distress, and he exhibits normal effort.      KPS = 100  100 - Normal; no complaints; no evidence of disease. 90   - Able to carry on normal activity; minor signs or symptoms of disease. 80   - Normal activity with effort; some signs or symptoms of disease. 42   - Cares for self; unable to carry on normal activity or to do active work. 60   - Requires occasional assistance, but is able to care for most of his personal needs. 50   - Requires considerable assistance and frequent medical care. 40   - Disabled; requires special care and assistance. 30   - Severely disabled; hospital admission is indicated although death not imminent. 20   - Very sick; hospital admission necessary; active supportive treatment necessary. 10   - Moribund; fatal processes progressing rapidly. 0     - Dead  Karnofsky DA, Abelmann WH, Craver LS and Burchenal [facility] [phone]) The use of the nitrogen mustards in the palliative treatment of carcinoma: with particular reference to bronchogenic carcinoma Cancer 1 634-56  LABORATORY DATA:  Lab Results  Component Value Date   WBC 4.2 01/01/2022   HGB 12.5 (L) 01/01/2022   HCT 41.8 01/01/2022   MCV 93.9 01/01/2022   PLT 238 01/01/2022   Lab Results  Component Value Date   NA 137 01/01/2022   K 4.2 01/01/2022   CL 101 01/01/2022   CO2 30 01/01/2022   Lab Results  Component Value Date   ALT 16 01/01/2022   AST 27 01/01/2022   ALKPHOS 59 01/01/2022   BILITOT 0.3 01/01/2022     RADIOGRAPHY: No results found.    IMPRESSION/PLAN: 1. 77 y.o. gentleman with Stage T2a adenocarcinoma of the prostate with Gleason score of 3+4, and PSA of 5.8.  The patient has elected to proceed with seed implant for treatment of his disease. We reviewed  the risks, benefits, short and long-term effects associated with brachytherapy and discussed the role of SpaceOAR in reducing the rectal toxicity associated with radiotherapy.  He appears to have a good understanding of his disease and our treatment recommendations which are of  curative intent.  He was encouraged to ask questions that were answered to his stated satisfaction. He has freely signed written consent to proceed today in the office and a copy of this document will be placed in his medical record. His procedure is tentatively scheduled for 08/14/23 in collaboration with Dr. Annabell Howells and we will see him back for his post-procedure visit approximately 3 weeks thereafter. We look forward to continuing to participate in his care. He knows that he is welcome to call with any questions or concerns at any time in the interim.  I personally spent 30 minutes in this encounter including chart review, reviewing radiological studies, meeting face-to-face with the patient, entering orders and completing documentation.    Marguarite Arbour, MMS, PA-C [place]  [facility] Radiation Oncology Physician Assistant Direct Dial: [phone]  Fax: [phone]

## 2023-07-23 ENCOUNTER — Ambulatory Visit
Admission: RE | Admit: 2023-07-23 | Discharge: 2023-07-23 | Disposition: A | Discharge status: Home / Self Care | Payer: Medicare HMO | Source: Ambulatory Visit | Attending: Urology | Admitting: Urology

## 2023-07-23 ENCOUNTER — Encounter (HOSPITAL_COMMUNITY)
Admission: RE | Admit: 2023-07-23 | Discharge: 2023-07-23 | Disposition: A | Discharge status: Home / Self Care | Payer: Medicare HMO | Source: Ambulatory Visit | Attending: Urology | Admitting: Urology

## 2023-07-23 ENCOUNTER — Ambulatory Visit
Admission: RE | Admit: 2023-07-23 | Discharge: 2023-07-23 | Disposition: A | Discharge status: Home / Self Care | Payer: Medicare HMO | Source: Ambulatory Visit | Attending: Radiation Oncology | Admitting: Radiation Oncology

## 2023-07-23 DIAGNOSIS — Z01810 Encounter for preprocedural cardiovascular examination: Secondary | ICD-10-CM | POA: Insufficient documentation

## 2023-07-23 DIAGNOSIS — C61 Malignant neoplasm of prostate: Secondary | ICD-10-CM | POA: Diagnosis present

## 2023-07-23 DIAGNOSIS — Z01818 Encounter for other preprocedural examination: Secondary | ICD-10-CM

## 2023-07-23 NOTE — Progress Notes (Signed)
Pre-seed nursing interview for a diagnosis of Stage T2a adenocarcinoma of the prostate with Gleason score of 3+4, and PSA of 5.8.  Patient identity verified x2.   Patient reports polyuria. No other issues conveyed at this time.  Meaningful use complete.  Urinary Management medication(s)- Tamsulosin Urology appointment date- 08/14/2023, with Dr. Annabell Howells at [facility]  No vitals needed for this visit. This concludes the interaction.  Alex Favors, LPN

## 2023-08-05 ENCOUNTER — Encounter (HOSPITAL_BASED_OUTPATIENT_CLINIC_OR_DEPARTMENT_OTHER): Payer: Self-pay | Admitting: Urology

## 2023-08-05 ENCOUNTER — Other Ambulatory Visit: Payer: Self-pay

## 2023-08-05 NOTE — Progress Notes (Signed)
Spoke w/ via phone for pre-op interview---pt Lab needs dos---- I stat        Lab results------EKG 07-23-2023 epic, chest xray 02-10-2023 epic COVID test -----patient states asymptomatic no test needed Arrive at -------530 08-14-2023 NPO after MN NO Solid Food.  Clear liquids from MN until---430 Med rec completed Medications to take morning of surgery -----eye drop, finasteride, montlukast, fluticasone nasal spray Diabetic medication -----take 1/2 dose hs lantus night before surgery ( take 4.5 units), no diabetic meds day of surgery Patient instructed no nail polish to be worn day of surgery Patient instructed to bring photo id and insurance card day of surgery Patient aware to have Driver (ride )  dtr felicia owens/ caregiver  wife mary   for 24 hours after surgery -  Patient Special Instructions -----received instructions from dr Annabell Howells to stop 81 mg asa 5 days before surgery, last dose to be 08-10-2023 Pre-Op special Instructions -----none Patient verbalized understanding of instructions that were given at this phone interview. Patient denies chest pain, sob, fever, cough at the interview.

## 2023-08-12 NOTE — H&P (Signed)
I have prostate cancer.  HPI: Alex Sanchez is a 77 year-old male established patient who is here evaluation for treatment of prostate cancer.    07/22/23: Alex Sanchez returns today in f/u. He is on finasteride to down size the prostate prior to a seed implant that is scheduled on 08/14/23. His PSA has fallen to 1.78 and he is voiding better with an IPSS of 13. He has nocturia x 2- 3. He remains on finasteride as well.   03/27/23: Alex Sanchez returns today to discuss his recent prostate biopsy done for a PSA of 6.1 and 5.8 on repeat and a right apical nodule. He was found to have a 59ml prostate with a T2a Nx Mx GG2 prostate cancer in the 2 right apical cores. His IPSS is 15 on tmasulosin.     AUA Symptom Score: Less than 50% of the time he has the sensation of not emptying his bladder completely when finished urinating. Less than 20% of the time he has to urinate again fewer than two hours after he has finished urinating. Less than 20% of the time he has to start and stop again several times when he urinates. Less than 50% of the time he finds it difficult to postpone urination. Less than 50% of the time he has a weak urinary stream. Less than 50% of the time he has to push or strain to begin urination. He has to get up to urinate 3 times from the time he goes to bed until the time he gets up in the morning.   Calculated AUA Symptom Score: 13    QOL Score: He would feel mixed if he had to live with his urinary condition the way it is now for the rest of his life.   Calculated QOL Symptom Score: 3    IPSS: Calculated IPSS: 16    ALLERGIES: Montelukast    MEDICATIONS: Aspirin 81 mg tablet,chewable  Finasteride 5 mg tablet 1 tablet PO Daily  Tamsulosin Hcl 0.4 mg capsule  Atorvastatin Calcium 20 mg tablet  Fluticasone Propionate  Glipizide 10 mg tablet  Losartan Potassium 25 mg tablet  Metformin Er Gastric 1,000 mg tablet, er gastric retention 24 hr  Montelukast Sodium  Multivitamin   Pioglitazone Hcl     GU PSH: Prostate Needle Biopsy - 03/18/2023     NON-GU PSH: Back Surgery (Unspecified) Surgical Pathology, Gross And Microscopic Examination For Prostate Needle - 03/18/2023 Visit Complexity (formerly GPC1X) - 03/27/2023     GU PMH: Prostate Cancer, T2a Nx Mx GG2 favorable intermediate risk prostate cancer with a nodular prostate with obstruction and moderate LUTS. I discussed options for therapy including Active surveillance, RALP, EXRT and Brachytherapy. I also discussed Cryo and HIFU along with focal therapy. I discussed the use of SpaceOAR and fiducial markers with radiation therapy. At his age of 26, active surveillance is a consideration but I think he is more interested in active treatment. My recommendation would be to consider a radiation therapy option and he is most interested in a seed implant. I will get him set up to see Alex Sanchez. I discussed the risks, benefits and rational for Brachytherapy. I reviewed the details of the procedure and the postoperative recovery expectations. I reviewed the risks of bleeding, infection, urinary retention with need for catheter drainage, injury to adjacent structures, severe prolonged LUTS possibly requiring medical or surgical therapy, incontinence, strictures, erectile dysfunction, bowel and bladder radiation injury, thrombotic events, anesthetic complications, urethra-rectal fistulae with need for additional surgery for  correction and possible colostomy, mortality and delay radiation effects with a risk of secondary malignancies, treatment failure with need for salvage therapy. - 03/27/2023 Prostate nodule w/ LUTS, His prostate is borderline large and he has moderate LUTS which would increase his risk of side effects from brachytherapy. I will have him continue the tamsulosin and add finasteride. I reviewed the side effects of the med and will send a script. - 03/27/2023, - 03/18/2023, - 01/26/2023 Weak Urinary Stream - 03/27/2023, -  01/26/2023 Elevated PSA - 03/18/2023, He has an elevated PSA with a left apical nodule. I will repeat the PSA today and get him set up for an [place] and biopsy. I have reviewed the risks of bleeding, infection and voiding symptoms., - 01/26/2023 Nocturia, He has moderate LUTS on tamsulosin. We will address the LUTS further after the biopsy. - 01/26/2023    NON-GU PMH: Arthritis Diabetes Type 2 Glaucoma    FAMILY HISTORY: Death In The Family Father - Other Death In The Family Mother - Other High Blood Pressure - Runs in Family Lung Cancer - Other   SOCIAL HISTORY: Marital Status: Married Preferred Language: English; Ethnicity: Not Hispanic Or Latino; Race: White Current Smoking Status: Patient has never smoked.   Tobacco Use Assessment Completed: Used Tobacco in last 30 days? Does not use smokeless tobacco. Has never drank.  Does not use drugs. Drinks 1 caffeinated drink per day.    REVIEW OF SYSTEMS:    GU Review Male:   Patient reports frequent urination and get up at night to urinate. Patient denies hard to postpone urination, burning/ pain with urination, leakage of urine, stream starts and stops, trouble starting your stream, have to strain to urinate , erection problems, and penile pain.  Gastrointestinal (Upper):   Patient denies nausea, vomiting, and indigestion/ heartburn.  Gastrointestinal (Lower):   Patient denies constipation and diarrhea.  Constitutional:   Patient denies fever, night sweats, weight loss, and fatigue.  Skin:   Patient denies skin rash/ lesion and itching.  Eyes:   Patient denies blurred vision and double vision.  Ears/ Nose/ Throat:   Patient denies sore throat and sinus problems.  Hematologic/Lymphatic:   Patient denies swollen glands and easy bruising.  Cardiovascular:   Patient denies leg swelling and chest pains.  Respiratory:   Patient denies cough and shortness of breath.  Endocrine:   Patient denies excessive thirst.  Musculoskeletal:   Patient denies  back pain and joint pain.  Neurological:   Patient denies headaches and dizziness.  Psychologic:   Patient denies depression and anxiety.   VITAL SIGNS: None   MULTI-SYSTEM PHYSICAL EXAMINATION:    Constitutional: Well-nourished. No physical deformities. Normally developed. Good grooming.   Respiratory: Normal breath sounds. No labored breathing, no use of accessory muscles.   Cardiovascular: Regular rate and rhythm. No murmur, no gallop.     Complexity of Data:  Lab Test Review:   PSA  Records Review:   AUA Symptom Score, Previous Patient Records  Urine Test Review:   Urinalysis   07/15/23 01/26/23 11/06/22  PSA  Total PSA 1.78 ng/mL 5.80 ng/mL 6.1 ng/ml  Free PSA  0.76 ng/mL   % Free PSA  13 % PSA     PROCEDURES:          Visit Complexity - G2211 Chronic management         Urinalysis Dipstick Dipstick Cont'd  Color: Yellow Bilirubin: Neg mg/dL  Appearance: Clear Ketones: Neg mg/dL  Specific Gravity: 1.610 Blood: Neg ery/uL  pH: 6.5 Protein: Neg mg/dL  Glucose: Neg mg/dL Urobilinogen: 0.2 mg/dL    Nitrites: Neg    Leukocyte Esterase: Neg leu/uL    ASSESSMENT:      ICD-10 Details  1 GU:   Prostate Cancer - C61 Chronic, Stable - His PSA is down appropriately on finasteride and he is voiding a bit better. He is good to go with the seed implant.   2   Elevated PSA - R97.20 Chronic, Stable, Improving  3   Prostate nodule w/ LUTS - N40.3 Chronic, Stable  4   Weak Urinary Stream - R39.12 Chronic, Stable, Improving   PLAN:            Medications Stop Meds: Levofloxacin 750 mg tablet 1 po 1 hour prior to the procedure  Start: 01/26/2023  Discontinue: 07/22/2023  - Reason: The medication cycle was completed.            Schedule Return Visit/Planned Activity: Keep Scheduled Appointment - Office Visit             Note: He is on for seeds on 08/14/23 and should keep his scheduled f/u.           Document Letter(s):  Created for Patient: Clinical Summary          Notes:   CC: Dr. Marylynn Pearson NP.         Next Appointment:      Next Appointment: 08/14/2023 07:30 AM    Appointment Type: Surgery     Location: [facility] [phone]    Provider: Bjorn Pippin, M.D.    Reason for Visit: OP NE SEEDS SPACE ORR

## 2023-08-13 ENCOUNTER — Ambulatory Visit (HOSPITAL_COMMUNITY): Payer: Medicare HMO | Admitting: Anesthesiology

## 2023-08-13 ENCOUNTER — Telehealth: Payer: Self-pay | Admitting: *Deleted

## 2023-08-13 NOTE — Telephone Encounter (Signed)
Called patient to remind of procedure for 08-14-23, spoke with patient and he is aware of this procedure

## 2023-08-13 NOTE — Anesthesia Preprocedure Evaluation (Addendum)
Anesthesia Evaluation  Patient identified by MRN, date of birth, ID band Patient awake    Reviewed: Allergy & Precautions, NPO status , Patient's Chart, lab work & pertinent test results  Airway Mallampati: II  TM Distance: >3 FB Neck ROM: Full    Dental no notable dental hx. (+) Teeth Intact, Dental Advisory Given   Pulmonary    Pulmonary exam normal breath sounds clear to auscultation       Cardiovascular hypertension, Pt. on medications Normal cardiovascular exam Rhythm:Regular Rate:Normal     Neuro/Psych negative neurological ROS  negative psych ROS   GI/Hepatic negative GI ROS, Neg liver ROS,,,  Endo/Other  diabetes, Type 2, Oral Hypoglycemic Agents    Renal/GU Lab Results      Component                Value               Date                      NA                       141                 08/14/2023                CL                       101                 08/14/2023                K                        3.9                 08/14/2023               BUN                      12                  08/14/2023                CREATININE               0.80                08/14/2023                   GLUCOSE                  147 (H)             08/14/2023              Prostate  CA    Musculoskeletal  (+) Arthritis ,    Abdominal   Peds  Hematology Lab Results      Component                Value               Date                           HGB  13.3                08/14/2023                HCT                      39.0                08/14/2023                  Anesthesia Other Findings All: codeine  Reproductive/Obstetrics                             Anesthesia Physical Anesthesia Plan  ASA:   Anesthesia Plan:    Post-op Pain Management:    Induction:   PONV Risk Score and Plan:   Airway Management Planned:   Additional Equipment:   Intra-op Plan:    Post-operative Plan:   Informed Consent:   Plan Discussed with:   Anesthesia Plan Comments: (Case cx due to NPO violation)       Anesthesia Quick Evaluation

## 2023-08-14 ENCOUNTER — Encounter (HOSPITAL_BASED_OUTPATIENT_CLINIC_OR_DEPARTMENT_OTHER): Payer: Self-pay | Admitting: Urology

## 2023-08-14 ENCOUNTER — Ambulatory Visit (HOSPITAL_BASED_OUTPATIENT_CLINIC_OR_DEPARTMENT_OTHER)
Admission: RE | Admit: 2023-08-14 | Discharge: 2023-08-14 | Disposition: A | Discharge status: Home / Self Care | Payer: Medicare HMO | Attending: Urology | Admitting: Urology

## 2023-08-14 ENCOUNTER — Encounter (HOSPITAL_BASED_OUTPATIENT_CLINIC_OR_DEPARTMENT_OTHER)
Admission: RE | Disposition: A | Discharge status: Home / Self Care | Payer: Self-pay | Source: Home / Self Care | Attending: Urology

## 2023-08-14 DIAGNOSIS — C61 Malignant neoplasm of prostate: Secondary | ICD-10-CM | POA: Diagnosis present

## 2023-08-14 DIAGNOSIS — I10 Essential (primary) hypertension: Secondary | ICD-10-CM | POA: Insufficient documentation

## 2023-08-14 DIAGNOSIS — Z7984 Long term (current) use of oral hypoglycemic drugs: Secondary | ICD-10-CM | POA: Diagnosis not present

## 2023-08-14 DIAGNOSIS — M1990 Unspecified osteoarthritis, unspecified site: Secondary | ICD-10-CM | POA: Diagnosis not present

## 2023-08-14 DIAGNOSIS — E119 Type 2 diabetes mellitus without complications: Secondary | ICD-10-CM | POA: Insufficient documentation

## 2023-08-14 DIAGNOSIS — Z01818 Encounter for other preprocedural examination: Secondary | ICD-10-CM

## 2023-08-14 DIAGNOSIS — Z5309 Procedure and treatment not carried out because of other contraindication: Secondary | ICD-10-CM | POA: Diagnosis not present

## 2023-08-14 HISTORY — DX: Hyperlipidemia, unspecified: E78.5

## 2023-08-14 HISTORY — DX: Presence of spectacles and contact lenses: Z97.3

## 2023-08-14 HISTORY — DX: Malignant neoplasm of prostate: C61

## 2023-08-14 LAB — POCT I-STAT, CHEM 8
BUN: 12 mg/dL (ref 8–23)
Calcium, Ion: 1.22 mmol/L (ref 1.15–1.40)
Chloride: 101 mmol/L (ref 98–111)
Creatinine, Ser: 0.8 mg/dL (ref 0.61–1.24)
Glucose, Bld: 147 mg/dL — ABNORMAL HIGH (ref 70–99)
HCT: 39 % (ref 39.0–52.0)
Hemoglobin: 13.3 g/dL (ref 13.0–17.0)
Potassium: 3.9 mmol/L (ref 3.5–5.1)
Sodium: 141 mmol/L (ref 135–145)
TCO2: 27 mmol/L (ref 22–32)

## 2023-08-14 SURGERY — INSERTION, RADIATION SOURCE, PROSTATE
Anesthesia: General

## 2023-08-14 MED ORDER — LIDOCAINE HCL (PF) 2 % IJ SOLN
INTRAMUSCULAR | Status: AC
  Filled 2023-08-14: qty 5

## 2023-08-14 MED ORDER — SODIUM CHLORIDE 0.9% FLUSH: 3.0000 mL | Freq: Two times a day (BID) | INTRAVENOUS | Status: DC

## 2023-08-14 MED ORDER — HYDROCODONE-ACETAMINOPHEN 5-325 MG PO TABS: 2.0000 | ORAL_TABLET | Freq: Four times a day (QID) | ORAL | 0 refills | Status: DC | PRN

## 2023-08-14 MED ORDER — PROPOFOL 10 MG/ML IV BOLUS
INTRAVENOUS | Status: AC
  Filled 2023-08-14: qty 20

## 2023-08-14 MED ORDER — LACTATED RINGERS IV SOLN: INTRAVENOUS | Status: DC

## 2023-08-14 MED ORDER — FENTANYL CITRATE (PF) 100 MCG/2ML IJ SOLN
INTRAMUSCULAR | Status: AC
  Filled 2023-08-14: qty 2

## 2023-08-14 MED ORDER — FLEET ENEMA 7-19 GM/118ML RE ENEM
1.0000 | ENEMA | Freq: Once | RECTAL | Status: DC
  Filled 2023-08-14: qty 1

## 2023-08-14 MED ORDER — ROCURONIUM BROMIDE 10 MG/ML (PF) SYRINGE
PREFILLED_SYRINGE | INTRAVENOUS | Status: AC
  Filled 2023-08-14: qty 10

## 2023-08-14 MED ORDER — CIPROFLOXACIN IN D5W 400 MG/200ML IV SOLN: 400.0000 mg | INTRAVENOUS | Status: DC

## 2023-08-14 MED ORDER — CIPROFLOXACIN IN D5W 400 MG/200ML IV SOLN
INTRAVENOUS | Status: AC
  Filled 2023-08-14: qty 200

## 2023-08-14 SURGICAL SUPPLY — 36 items
BAG DRN RND TRDRP ANRFLXCHMBR (UROLOGICAL SUPPLIES)
BAG URINE DRAIN 2000ML AR STRL (UROLOGICAL SUPPLIES) ×1 IMPLANT
BLADE CLIPPER SENSICLIP SURGIC (BLADE) ×1 IMPLANT
CATH FOLEY 2WAY SLVR 5CC 16FR (CATHETERS) IMPLANT
CATH ROBINSON RED A/P 20FR (CATHETERS) IMPLANT
CNTNR URN SCR LID CUP LEK RST (MISCELLANEOUS) IMPLANT
CONT SPEC 4OZ STRL OR WHT (MISCELLANEOUS)
COVER BACK TABLE 60X90IN (DRAPES) ×1 IMPLANT
COVER MAYO STAND STRL (DRAPES) ×1 IMPLANT
DRSG TEGADERM 4X4.75 (GAUZE/BANDAGES/DRESSINGS) ×1 IMPLANT
GLOVE BIO SURGEON STRL SZ 6.5 (GLOVE) IMPLANT
GLOVE BIO SURGEON STRL SZ7.5 (GLOVE) IMPLANT
GLOVE BIO SURGEON STRL SZ8 (GLOVE) IMPLANT
GLOVE BIOGEL PI IND STRL 6.5 (GLOVE) ×1 IMPLANT
GLOVE SURG ORTHO 8.5 STRL (GLOVE) ×1 IMPLANT
GLOVE SURG SS PI 7.0 STRL IVOR (GLOVE) IMPLANT
GLOVE SURG SS PI 8.0 STRL IVOR (GLOVE) ×1 IMPLANT
GOWN STRL REUS W/TWL LRG LVL3 (GOWN DISPOSABLE) IMPLANT
GOWN STRL REUS W/TWL XL LVL3 (GOWN DISPOSABLE) IMPLANT
GRID BRACH TEMP 18GA 2.8X3X.75 (MISCELLANEOUS) IMPLANT
IMPLANT SPACEOAR VUE SYSTEM (Spacer) ×1 IMPLANT
KIT TURNOVER CYSTO (KITS) IMPLANT
NDL BRACHY 18G 5PK (NEEDLE) ×4 IMPLANT
NDL BRACHY 18G SINGLE (NEEDLE) IMPLANT
NEEDLE BRACHY 18G 5PK (NEEDLE) IMPLANT
NEEDLE BRACHY 18G SINGLE (NEEDLE) IMPLANT
NEEDLE PK MORGANSTERN STABILIZ (NEEDLE) IMPLANT
PACK CYSTO (CUSTOM PROCEDURE TRAY) IMPLANT
SHEATH ULTRASOUND LF (SHEATH) IMPLANT
SHEATH ULTRASOUND LTX NONSTRL (SHEATH) IMPLANT
SLEEVE SCD COMPRESS KNEE MED (STOCKING) ×1 IMPLANT
SUT BONE WAX W31G (SUTURE) IMPLANT
SYR 10ML LL (SYRINGE) ×1 IMPLANT
SYR CONTROL 10ML LL (SYRINGE) ×1 IMPLANT
TOWEL OR 17X24 6PK STRL BLUE (TOWEL DISPOSABLE) IMPLANT
WATER STERILE IRR 500ML POUR (IV SOLUTION) IMPLANT

## 2023-08-14 NOTE — Progress Notes (Signed)
Surgery was canceled for today. Patient ate crackers at 0430 for low blood sugar.

## 2023-08-14 NOTE — Interval H&P Note (Signed)
History and Physical Interval Note:  08/14/2023 7:16 AM  Alex Sanchez  has presented today for surgery, with the diagnosis of PROSTATE CANCER.  The various methods of treatment have been discussed with the patient and family. After consideration of risks, benefits and other options for treatment, the patient has consented to  Procedure(s): RADIOACTIVE SEED IMPLANT/BRACHYTHERAPY IMPLANT (N/A) SPACE OAR INSTILLATION (N/A) as a surgical intervention.  The patient's history has been reviewed, patient examined, no change in status, stable for surgery.  I have reviewed the patient's chart and labs.  Questions were answered to the patient's satisfaction.     Bjorn Pippin

## 2023-08-17 ENCOUNTER — Telehealth: Payer: Self-pay | Admitting: *Deleted

## 2023-08-17 NOTE — Telephone Encounter (Signed)
Called patient to inform of implant being moved to Nov. 8, spoke with patient and he is aware of the new date

## 2023-09-14 ENCOUNTER — Encounter (HOSPITAL_BASED_OUTPATIENT_CLINIC_OR_DEPARTMENT_OTHER): Payer: Self-pay | Admitting: Urology

## 2023-09-14 ENCOUNTER — Other Ambulatory Visit: Payer: Self-pay | Admitting: Urology

## 2023-09-14 ENCOUNTER — Other Ambulatory Visit: Payer: Self-pay

## 2023-09-14 NOTE — Progress Notes (Signed)
Spoke w/ via phone for pre-op interview---pt Lab needs dos----   I stat  , req orders with pam gibson    Lab results------EKG 07-23-2023 epic, chest xray 02-10-2023 epic COVID test -----patient states asymptomatic no test needed Arrive at -------930 09-18-2023 NPO after MN NO Solid Food.  Clear liquids from MN until---830 Med rec completed Medications to take morning of surgery -----finasteride montelukast, fluticasone nasal spray Diabetic medication -----n/a Patient instructed no nail polish to be worn day of surgery Patient instructed to bring photo id and insurance card day of surgery Patient aware to have Driver (ride ) / caregiver    for 24 hours after surgery -  daughter Avie Echevaria Patient Special Instructions -----fleets enema am of surgery Pre-Op special Instructions -----pt stopped 81 mg asa 09-13-2023 per dr Annabell Howells instructions Patient verbalized understanding of instructions that were given at this phone interview. Patient denies chest pain, sob, fever, cough at the interview.

## 2023-09-16 NOTE — H&P (Signed)
I have prostate cancer.  HPI: Alex Sanchez is a 77 year-old male established patient who is here evaluation for treatment of prostate cancer.    07/22/23: Alex Sanchez returns today in f/u. He is on finasteride to down size the prostate prior to a seed implant that is scheduled on 08/14/23. His PSA has fallen to 1.78 and he is voiding better with an IPSS of 13. He has nocturia x 2- 3. He remains on finasteride as well.   03/27/23: Alex Sanchez returns today to discuss his recent prostate biopsy done for a PSA of 6.1 and 5.8 on repeat and a right apical nodule. He was found to have a 59ml prostate with a T2a Nx Mx GG2 prostate cancer in the 2 right apical cores. His IPSS is 15 on tmasulosin.     AUA Symptom Score: Less than 50% of the time he has the sensation of not emptying his bladder completely when finished urinating. Less than 20% of the time he has to urinate again fewer than two hours after he has finished urinating. Less than 20% of the time he has to start and stop again several times when he urinates. Less than 50% of the time he finds it difficult to postpone urination. Less than 50% of the time he has a weak urinary stream. Less than 50% of the time he has to push or strain to begin urination. He has to get up to urinate 3 times from the time he goes to bed until the time he gets up in the morning.   Calculated AUA Symptom Score: 13    QOL Score: He would feel mixed if he had to live with his urinary condition the way it is now for the rest of his life.   Calculated QOL Symptom Score: 3    IPSS: Calculated IPSS: 16    ALLERGIES: Montelukast    MEDICATIONS: Aspirin 81 mg tablet,chewable  Finasteride 5 mg tablet 1 tablet PO Daily  Tamsulosin Hcl 0.4 mg capsule  Atorvastatin Calcium 20 mg tablet  Fluticasone Propionate  Glipizide 10 mg tablet  Losartan Potassium 25 mg tablet  Metformin Er Gastric 1,000 mg tablet, er gastric retention 24 hr  Montelukast Sodium  Multivitamin   Pioglitazone Hcl     GU PSH: Prostate Needle Biopsy - 03/18/2023     NON-GU PSH: Back Surgery (Unspecified) Surgical Pathology, Gross And Microscopic Examination For Prostate Needle - 03/18/2023 Visit Complexity (formerly GPC1X) - 03/27/2023     GU PMH: Prostate Cancer, T2a Nx Mx GG2 favorable intermediate risk prostate cancer with a nodular prostate with obstruction and moderate LUTS. I discussed options for therapy including Active surveillance, RALP, EXRT and Brachytherapy. I also discussed Cryo and HIFU along with focal therapy. I discussed the use of SpaceOAR and fiducial markers with radiation therapy. At his age of 26, active surveillance is a consideration but I think he is more interested in active treatment. My recommendation would be to consider a radiation therapy option and he is most interested in a seed implant. I will get him set up to see Alex Sanchez. I discussed the risks, benefits and rational for Brachytherapy. I reviewed the details of the procedure and the postoperative recovery expectations. I reviewed the risks of bleeding, infection, urinary retention with need for catheter drainage, injury to adjacent structures, severe prolonged LUTS possibly requiring medical or surgical therapy, incontinence, strictures, erectile dysfunction, bowel and bladder radiation injury, thrombotic events, anesthetic complications, urethra-rectal fistulae with need for additional surgery for  correction and possible colostomy, mortality and delay radiation effects with a risk of secondary malignancies, treatment failure with need for salvage therapy. - 03/27/2023 Prostate nodule w/ LUTS, His prostate is borderline large and he has moderate LUTS which would increase his risk of side effects from brachytherapy. I will have him continue the tamsulosin and add finasteride. I reviewed the side effects of the med and will send a script. - 03/27/2023, - 03/18/2023, - 01/26/2023 Weak Urinary Stream - 03/27/2023, -  01/26/2023 Elevated PSA - 03/18/2023, He has an elevated PSA with a left apical nodule. I will repeat the PSA today and get him set up for an [place] and biopsy. I have reviewed the risks of bleeding, infection and voiding symptoms., - 01/26/2023 Nocturia, He has moderate LUTS on tamsulosin. We will address the LUTS further after the biopsy. - 01/26/2023    NON-GU PMH: Arthritis Diabetes Type 2 Glaucoma    FAMILY HISTORY: Death In The Family Father - Other Death In The Family Mother - Other High Blood Pressure - Runs in Family Lung Cancer - Other   SOCIAL HISTORY: Marital Status: Married Preferred Language: English; Ethnicity: Not Hispanic Or Latino; Race: White Current Smoking Status: Patient has never smoked.   Tobacco Use Assessment Completed: Used Tobacco in last 30 days? Does not use smokeless tobacco. Has never drank.  Does not use drugs. Drinks 1 caffeinated drink per day.    REVIEW OF SYSTEMS:    GU Review Male:   Patient reports frequent urination and get up at night to urinate. Patient denies hard to postpone urination, burning/ pain with urination, leakage of urine, stream starts and stops, trouble starting your stream, have to strain to urinate , erection problems, and penile pain.  Gastrointestinal (Upper):   Patient denies nausea, vomiting, and indigestion/ heartburn.  Gastrointestinal (Lower):   Patient denies constipation and diarrhea.  Constitutional:   Patient denies fever, night sweats, weight loss, and fatigue.  Skin:   Patient denies skin rash/ lesion and itching.  Eyes:   Patient denies blurred vision and double vision.  Ears/ Nose/ Throat:   Patient denies sore throat and sinus problems.  Hematologic/Lymphatic:   Patient denies swollen glands and easy bruising.  Cardiovascular:   Patient denies leg swelling and chest pains.  Respiratory:   Patient denies cough and shortness of breath.  Endocrine:   Patient denies excessive thirst.  Musculoskeletal:   Patient denies  back pain and joint pain.  Neurological:   Patient denies headaches and dizziness.  Psychologic:   Patient denies depression and anxiety.   VITAL SIGNS: None   MULTI-SYSTEM PHYSICAL EXAMINATION:    Constitutional: Well-nourished. No physical deformities. Normally developed. Good grooming.   Respiratory: Normal breath sounds. No labored breathing, no use of accessory muscles.   Cardiovascular: Regular rate and rhythm. No murmur, no gallop.     Complexity of Data:  Lab Test Review:   PSA  Records Review:   AUA Symptom Score, Previous Patient Records  Urine Test Review:   Urinalysis   07/15/23 01/26/23 11/06/22  PSA  Total PSA 1.78 ng/mL 5.80 ng/mL 6.1 ng/ml  Free PSA  0.76 ng/mL   % Free PSA  13 % PSA     PROCEDURES:          Visit Complexity - G2211 Chronic management         Urinalysis Dipstick Dipstick Cont'd  Color: Yellow Bilirubin: Neg mg/dL  Appearance: Clear Ketones: Neg mg/dL  Specific Gravity: 1.610 Blood: Neg ery/uL  pH: 6.5 Protein: Neg mg/dL  Glucose: Neg mg/dL Urobilinogen: 0.2 mg/dL    Nitrites: Neg    Leukocyte Esterase: Neg leu/uL    ASSESSMENT:      ICD-10 Details  1 GU:   Prostate Cancer - C61 Chronic, Stable - His PSA is down appropriately on finasteride and he is voiding a bit better. He is good to go with the seed implant.   2   Elevated PSA - R97.20 Chronic, Stable, Improving  3   Prostate nodule w/ LUTS - N40.3 Chronic, Stable  4   Weak Urinary Stream - R39.12 Chronic, Stable, Improving   PLAN:            Medications Stop Meds: Levofloxacin 750 mg tablet 1 po 1 hour prior to the procedure  Start: 01/26/2023  Discontinue: 07/22/2023  - Reason: The medication cycle was completed.            Schedule Return Visit/Planned Activity: Keep Scheduled Appointment - Office Visit             Note: He is on for seeds on 08/14/23 and should keep his scheduled f/u.           Document Letter(s):  Created for Patient: Clinical Summary          Notes:   CC: Dr. Marylynn Pearson NP.         Next Appointment:      Next Appointment: 08/14/2023 07:30 AM    Appointment Type: Surgery     Location: [facility] [phone]    Provider: Bjorn Pippin, M.D.    Reason for Visit: OP NE SEEDS SPACE ORR

## 2023-09-17 ENCOUNTER — Telehealth: Payer: Self-pay | Admitting: *Deleted

## 2023-09-17 NOTE — Telephone Encounter (Signed)
CALLED PATIENT TO REMIND OF PROCEDURE FOR 09-18-23, SPOKE WITH PATIENT AND HE IS AWARE OF THIS PROCEDURE

## 2023-09-18 ENCOUNTER — Ambulatory Visit (HOSPITAL_BASED_OUTPATIENT_CLINIC_OR_DEPARTMENT_OTHER)
Admission: RE | Admit: 2023-09-18 | Discharge: 2023-09-18 | Disposition: A | Discharge status: Home / Self Care | Payer: Medicare HMO | Attending: Urology | Admitting: Urology

## 2023-09-18 ENCOUNTER — Encounter (HOSPITAL_BASED_OUTPATIENT_CLINIC_OR_DEPARTMENT_OTHER): Payer: Self-pay | Admitting: Urology

## 2023-09-18 ENCOUNTER — Ambulatory Visit (HOSPITAL_BASED_OUTPATIENT_CLINIC_OR_DEPARTMENT_OTHER): Payer: Medicare HMO | Admitting: Anesthesiology

## 2023-09-18 ENCOUNTER — Encounter (HOSPITAL_BASED_OUTPATIENT_CLINIC_OR_DEPARTMENT_OTHER)
Admission: RE | Disposition: A | Discharge status: Home / Self Care | Payer: Self-pay | Source: Home / Self Care | Attending: Urology

## 2023-09-18 ENCOUNTER — Ambulatory Visit (HOSPITAL_COMMUNITY): Payer: Medicare HMO

## 2023-09-18 DIAGNOSIS — Z7984 Long term (current) use of oral hypoglycemic drugs: Secondary | ICD-10-CM | POA: Diagnosis not present

## 2023-09-18 DIAGNOSIS — N403 Nodular prostate with lower urinary tract symptoms: Secondary | ICD-10-CM | POA: Insufficient documentation

## 2023-09-18 DIAGNOSIS — M1990 Unspecified osteoarthritis, unspecified site: Secondary | ICD-10-CM | POA: Diagnosis not present

## 2023-09-18 DIAGNOSIS — Z79899 Other long term (current) drug therapy: Secondary | ICD-10-CM | POA: Diagnosis not present

## 2023-09-18 DIAGNOSIS — J449 Chronic obstructive pulmonary disease, unspecified: Secondary | ICD-10-CM | POA: Diagnosis not present

## 2023-09-18 DIAGNOSIS — R3912 Poor urinary stream: Secondary | ICD-10-CM | POA: Diagnosis not present

## 2023-09-18 DIAGNOSIS — I10 Essential (primary) hypertension: Secondary | ICD-10-CM | POA: Insufficient documentation

## 2023-09-18 DIAGNOSIS — C61 Malignant neoplasm of prostate: Secondary | ICD-10-CM

## 2023-09-18 DIAGNOSIS — E119 Type 2 diabetes mellitus without complications: Secondary | ICD-10-CM | POA: Insufficient documentation

## 2023-09-18 HISTORY — PX: CYSTOSCOPY: SHX5120

## 2023-09-18 HISTORY — PX: RADIOACTIVE SEED IMPLANT: SHX5150

## 2023-09-18 HISTORY — PX: SPACE OAR INSTILLATION: SHX6769

## 2023-09-18 LAB — POCT I-STAT, CHEM 8
BUN: 10 mg/dL (ref 8–23)
Calcium, Ion: 1.24 mmol/L (ref 1.15–1.40)
Chloride: 98 mmol/L (ref 98–111)
Creatinine, Ser: 0.8 mg/dL (ref 0.61–1.24)
Glucose, Bld: 160 mg/dL — ABNORMAL HIGH (ref 70–99)
HCT: 38 % — ABNORMAL LOW (ref 39.0–52.0)
Hemoglobin: 12.9 g/dL — ABNORMAL LOW (ref 13.0–17.0)
Potassium: 4.1 mmol/L (ref 3.5–5.1)
Sodium: 141 mmol/L (ref 135–145)
TCO2: 29 mmol/L (ref 22–32)

## 2023-09-18 LAB — GLUCOSE, CAPILLARY: Glucose-Capillary: 154 mg/dL — ABNORMAL HIGH (ref 70–99)

## 2023-09-18 SURGERY — INSERTION, RADIATION SOURCE, PROSTATE
Anesthesia: General | Site: Perineum

## 2023-09-18 MED ORDER — LACTATED RINGERS IV SOLN: INTRAVENOUS | Status: DC

## 2023-09-18 MED ORDER — FLEET ENEMA RE ENEM: 1.0000 | ENEMA | Freq: Once | RECTAL | Status: DC

## 2023-09-18 MED ORDER — ONDANSETRON HCL 4 MG/2ML IJ SOLN
INTRAMUSCULAR | Status: DC | PRN
  Administered 2023-09-18: 4 mg via INTRAVENOUS

## 2023-09-18 MED ORDER — FENTANYL CITRATE (PF) 250 MCG/5ML IJ SOLN
INTRAMUSCULAR | Status: DC | PRN
  Administered 2023-09-18 (×2): 25 ug via INTRAVENOUS
  Administered 2023-09-18: 50 ug via INTRAVENOUS

## 2023-09-18 MED ORDER — CIPROFLOXACIN IN D5W 400 MG/200ML IV SOLN
INTRAVENOUS | Status: AC
  Filled 2023-09-18: qty 200

## 2023-09-18 MED ORDER — LIDOCAINE 2% (20 MG/ML) 5 ML SYRINGE
INTRAMUSCULAR | Status: DC | PRN
  Administered 2023-09-18: 40 mg via INTRAVENOUS

## 2023-09-18 MED ORDER — MIDAZOLAM HCL 2 MG/2ML IJ SOLN
INTRAMUSCULAR | Status: DC | PRN
  Administered 2023-09-18: 1 mg via INTRAVENOUS

## 2023-09-18 MED ORDER — HYDROCODONE-ACETAMINOPHEN 5-325 MG PO TABS: 2.0000 | ORAL_TABLET | Freq: Four times a day (QID) | ORAL | 0 refills | Status: DC | PRN

## 2023-09-18 MED ORDER — SODIUM CHLORIDE 0.9 % IR SOLN
Status: DC | PRN
  Administered 2023-09-18: 1000 mL via INTRAVESICAL

## 2023-09-18 MED ORDER — MIDAZOLAM HCL 2 MG/2ML IJ SOLN
0.5000 mg | Freq: Once | INTRAMUSCULAR | Status: DC | PRN
Start: 2023-09-18 — End: 2023-09-18

## 2023-09-18 MED ORDER — SODIUM CHLORIDE 0.9 % IV SOLN: INTRAVENOUS | Status: DC

## 2023-09-18 MED ORDER — ROCURONIUM BROMIDE 10 MG/ML (PF) SYRINGE
PREFILLED_SYRINGE | INTRAVENOUS | Status: DC | PRN
  Administered 2023-09-18: 60 mg via INTRAVENOUS

## 2023-09-18 MED ORDER — PHENYLEPHRINE 80 MCG/ML (10ML) SYRINGE FOR IV PUSH (FOR BLOOD PRESSURE SUPPORT)
PREFILLED_SYRINGE | INTRAVENOUS | Status: DC | PRN
  Administered 2023-09-18 (×3): 80 ug via INTRAVENOUS

## 2023-09-18 MED ORDER — ACETAMINOPHEN 500 MG PO TABS
1000.0000 mg | ORAL_TABLET | Freq: Once | ORAL | Status: AC
  Administered 2023-09-18: 1000 mg via ORAL

## 2023-09-18 MED ORDER — SUGAMMADEX SODIUM 200 MG/2ML IV SOLN
INTRAVENOUS | Status: DC | PRN
  Administered 2023-09-18: 400 mg via INTRAVENOUS

## 2023-09-18 MED ORDER — CIPROFLOXACIN IN D5W 400 MG/200ML IV SOLN
400.0000 mg | INTRAVENOUS | Status: AC
  Administered 2023-09-18: 400 mg via INTRAVENOUS

## 2023-09-18 MED ORDER — PROPOFOL 10 MG/ML IV BOLUS
INTRAVENOUS | Status: DC | PRN
  Administered 2023-09-18: 200 mg via INTRAVENOUS

## 2023-09-18 MED ORDER — OXYCODONE HCL 5 MG PO TABS: 5.0000 mg | ORAL_TABLET | Freq: Once | ORAL | Status: DC | PRN

## 2023-09-18 MED ORDER — IOHEXOL 300 MG/ML  SOLN
INTRAMUSCULAR | Status: DC | PRN
  Administered 2023-09-18: 5 mL

## 2023-09-18 MED ORDER — FENTANYL CITRATE (PF) 100 MCG/2ML IJ SOLN
INTRAMUSCULAR | Status: AC
  Filled 2023-09-18: qty 2

## 2023-09-18 MED ORDER — MIDAZOLAM HCL 2 MG/2ML IJ SOLN
INTRAMUSCULAR | Status: AC
  Filled 2023-09-18: qty 2

## 2023-09-18 MED ORDER — DEXAMETHASONE SODIUM PHOSPHATE 10 MG/ML IJ SOLN
INTRAMUSCULAR | Status: DC | PRN
  Administered 2023-09-18: 5 mg via INTRAVENOUS

## 2023-09-18 MED ORDER — OXYCODONE HCL 5 MG/5ML PO SOLN: 5.0000 mg | Freq: Once | ORAL | Status: DC | PRN

## 2023-09-18 MED ORDER — SODIUM CHLORIDE (PF) 0.9 % IJ SOLN
INTRAMUSCULAR | Status: DC | PRN
  Administered 2023-09-18: 5 mL

## 2023-09-18 MED ORDER — PROPOFOL 10 MG/ML IV BOLUS
INTRAVENOUS | Status: AC
  Filled 2023-09-18: qty 20

## 2023-09-18 MED ORDER — ACETAMINOPHEN 500 MG PO TABS
ORAL_TABLET | ORAL | Status: AC
  Filled 2023-09-18: qty 2

## 2023-09-18 MED ORDER — SODIUM CHLORIDE 0.9% FLUSH: 3.0000 mL | Freq: Two times a day (BID) | INTRAVENOUS | Status: DC

## 2023-09-18 MED ORDER — FENTANYL CITRATE (PF) 100 MCG/2ML IJ SOLN: 25.0000 ug | INTRAMUSCULAR | Status: DC | PRN

## 2023-09-18 SURGICAL SUPPLY — 45 items
BAG DRN RND TRDRP ANRFLXCHMBR (UROLOGICAL SUPPLIES) ×2
BAG URINE DRAIN 2000ML AR STRL (UROLOGICAL SUPPLIES) ×2 IMPLANT
BLADE CLIPPER SENSICLIP SURGIC (BLADE) ×2 IMPLANT
Bard QuickLink Cartridges with BrachySource I-125 IMPLANT
CATH FOLEY 2WAY SLVR 5CC 16FR (CATHETERS) ×2 IMPLANT
CATH ROBINSON RED A/P 16FR (CATHETERS) IMPLANT
CATH ROBINSON RED A/P 20FR (CATHETERS) ×2 IMPLANT
CLOTH BEACON ORANGE TIMEOUT ST (SAFETY) ×2 IMPLANT
CNTNR URN SCR LID CUP LEK RST (MISCELLANEOUS) ×2 IMPLANT
CONT SPEC 4OZ STRL OR WHT (MISCELLANEOUS) ×2
COVER BACK TABLE 60X90IN (DRAPES) ×2 IMPLANT
COVER MAYO STAND STRL (DRAPES) ×2 IMPLANT
DRSG TEGADERM 4X4.75 (GAUZE/BANDAGES/DRESSINGS) ×2 IMPLANT
DRSG TEGADERM 8X12 (GAUZE/BANDAGES/DRESSINGS) ×2 IMPLANT
GLOVE BIO SURGEON STRL SZ 6.5 (GLOVE) IMPLANT
GLOVE BIO SURGEON STRL SZ7.5 (GLOVE) IMPLANT
GLOVE BIO SURGEON STRL SZ8 (GLOVE) IMPLANT
GLOVE BIOGEL PI IND STRL 6.5 (GLOVE) ×2 IMPLANT
GLOVE SURG ORTHO 8.5 STRL (GLOVE) ×2 IMPLANT
GLOVE SURG SS PI 8.0 STRL IVOR (GLOVE) ×2 IMPLANT
GOWN STRL REUS W/TWL LRG LVL3 (GOWN DISPOSABLE) ×2 IMPLANT
GRID BRACH TEMP 18GA 2.8X3X.75 (MISCELLANEOUS) ×2 IMPLANT
HOLDER FOLEY CATH W/STRAP (MISCELLANEOUS) ×2 IMPLANT
IMPL SPACEOAR VUE SYSTEM (Spacer) ×2 IMPLANT
IMPLANT SPACEOAR VUE SYSTEM (Spacer) ×2 IMPLANT
IV NS 1000ML (IV SOLUTION) ×2
IV NS 1000ML BAXH (IV SOLUTION) ×2 IMPLANT
KIT TURNOVER CYSTO (KITS) ×2 IMPLANT
NDL BRACHY 18G 5PK (NEEDLE) ×8 IMPLANT
NDL BRACHY 18G SINGLE (NEEDLE) IMPLANT
NDL PK MORGANSTERN STABILIZ (NEEDLE) ×2 IMPLANT
NEEDLE BRACHY 18G 5PK (NEEDLE) ×8 IMPLANT
NEEDLE BRACHY 18G SINGLE (NEEDLE) IMPLANT
NEEDLE PK MORGANSTERN STABILIZ (NEEDLE) ×2 IMPLANT
PACK CYSTO (CUSTOM PROCEDURE TRAY) ×2 IMPLANT
SHEATH ULTRASOUND LF (SHEATH) IMPLANT
SHEATH ULTRASOUND LTX NONSTRL (SHEATH) IMPLANT
SLEEVE SCD COMPRESS KNEE MED (STOCKING) ×2 IMPLANT
SUT BONE WAX W31G (SUTURE) IMPLANT
SYR 10ML LL (SYRINGE) ×2 IMPLANT
SYR CONTROL 10ML LL (SYRINGE) ×2 IMPLANT
TOWEL OR 17X24 6PK STRL BLUE (TOWEL DISPOSABLE) ×2 IMPLANT
UNDERPAD 30X36 HEAVY ABSORB (UNDERPADS AND DIAPERS) ×4 IMPLANT
WATER STERILE IRR 3000ML UROMA (IV SOLUTION) IMPLANT
WATER STERILE IRR 500ML POUR (IV SOLUTION) ×2 IMPLANT

## 2023-09-18 NOTE — Op Note (Signed)
PATIENT:  [name]  PRE-OPERATIVE DIAGNOSIS:  Adenocarcinoma of the prostate  POST-OPERATIVE DIAGNOSIS:  Same  PROCEDURE:  Procedure(s): 1. I-125 radioactive seed implantation 2. SpaceOAR implantation. 3.  Cystoscopy  SURGEON:  Surgeon(s): Bjorn Pippin MD  Radiation oncologist: Dr. Margaretmary Dys  ANESTHESIA:  General  EBL:  Minimal  DRAINS: 16 French Foley catheter  INDICATION: Alex Sanchez is a 77 y.o. with Stage T2a, Gleason 7(3+4) prostate cancer who has elected brachytherapy for treatment.  Description of procedure: After informed consent the patient was brought to the major OR, placed on the table and administered general anesthesia. He was then moved to the modified lithotomy position with his perineum perpendicular to the floor. His perineum and genitalia were then sterilely prepped. An official timeout was then performed. A 16 French Foley catheter was then placed in the bladder and filled with dilute contrast, a rectal tube was placed in the rectum and the transrectal ultrasound probe was placed in the rectum and affixed to the stand. He was then sterilely draped.  The sterile grid was installed.   Anchor needles were then placed.   Real time ultrasonography was used along with the seed planning software spot-pro version 3.1-00. This was used to develop the seed plan including the number of needles as well as number of seeds required for complete and adequate coverage. Real-time ultrasonography was then used along with the previously developed plan  to implant a total of 15 seeds using 65 needles for a target dose of 145 Gy. This proceeded without difficulty or complication.  The anchor needles and guide were removed and the SpaceOAR needle was passed under US guidance into the fat stripe posterior to the prostate with the tip in the midline at mid prostate. A puff of NS confirmed appropriate positioning and the SpaceOAR VUE polymer was then injected over 15 seconds into  the space with excellent distribution.     A Foley catheter was then removed as well as the transrectal ultrasound probe and rectal probe. Flexible cystoscopy was then performed using the 17 French flexible scope which revealed a normal urethra throughout its length down to the sphincter which appeared intact. The prostatic urethra was 3cm with trilobar hyperplasia. The bladder was then entered and fully and systematically inspected.  There was some bloody urine but no exposed seeds or mucosal abnormalities.  The ureteral orifices were noted to be of normal configuration and position. . There were also no stones identified within the bladder.  No seeds or spacers were seen and/or removed from the bladder.  The cystoscope was then removed.  The drapes were removed.  The perineum was cleaned and dressed.  He was taken out of the lithotomy position and was awakened and taken to recovery room in stable and satisfactory condition. He tolerated procedure well and there were no intraoperative complications.

## 2023-09-18 NOTE — Interval H&P Note (Signed)
History and Physical Interval Note:    09/18/2023 10:22 AM  Alex Sanchez  has presented today for surgery, with the diagnosis of prostate cancer.  The various methods of treatment have been discussed with the patient and family. After consideration of risks, benefits and other options for treatment, the patient has consented to  Procedure(s): RADIOACTIVE SEED IMPLANT/BRACHYTHERAPY IMPLANT (N/A) SPACE OAR INSTILLATION (N/A) as a surgical intervention.  The patient's history has been reviewed, patient examined, no change in status, stable for surgery.  I have reviewed the patient's chart and labs.  Questions were answered to the patient's satisfaction.     Bjorn Pippin

## 2023-09-18 NOTE — Anesthesia Postprocedure Evaluation (Signed)
Anesthesia Post Note  Patient: IZAIA ARONSON  Procedure(s) Performed: RADIOACTIVE SEED IMPLANT/BRACHYTHERAPY IMPLANT (Perineum) SPACE OAR INSTILLATION (Perineum) CYSTOSCOPY (Bladder)     Patient location during evaluation: PACU Anesthesia Type: General Level of consciousness: awake and alert, patient cooperative and oriented Pain management: pain level controlled Vital Signs Assessment: post-procedure vital signs reviewed and stable Respiratory status: spontaneous breathing, nonlabored ventilation and respiratory function stable Cardiovascular status: blood pressure returned to baseline and stable Postop Assessment: no apparent nausea or vomiting and able to ambulate Anesthetic complications: no   No notable events documented.  Last Vitals:  Vitals:   09/18/23 1330 09/18/23 1345  BP: (!) 160/76 (!) 161/81  Pulse: 69 69  Resp: 13 10  Temp:    SpO2: 99% 92%    Last Pain:  Vitals:   09/18/23 1330  TempSrc:   PainSc: 0-No pain                 Ellard Nan,E. Tarance Balan

## 2023-09-18 NOTE — Transfer of Care (Signed)
Immediate Anesthesia Transfer of Care Note  Patient: Alex Sanchez  Procedure(s) Performed: RADIOACTIVE SEED IMPLANT/BRACHYTHERAPY IMPLANT (Perineum) SPACE OAR INSTILLATION (Perineum) CYSTOSCOPY (Bladder)  Patient Location: PACU  Anesthesia Type:General  Level of Consciousness: awake, alert , and oriented  Airway & Oxygen Therapy: Patient Spontanous Breathing and Patient connected to nasal cannula oxygen  Post-op Assessment: Report given to RN and Post -op Vital signs reviewed and stable  Post vital signs: Reviewed and stable  Last Vitals:  Vitals Value Taken Time  BP 155/73 09/18/23 1310  Temp    Pulse 74 09/18/23 1311  Resp 11 09/18/23 1311  SpO2 97 % 09/18/23 1311  Vitals shown include unfiled device data.  Last Pain:  Vitals:   09/18/23 0958  TempSrc: Oral  PainSc: 0-No pain      Patients Stated Pain Goal: 5 (09/18/23 9147)  Complications: No notable events documented.

## 2023-09-18 NOTE — Anesthesia Preprocedure Evaluation (Addendum)
Anesthesia Evaluation  Patient identified by MRN, date of birth, ID band Patient awake    Reviewed: Allergy & Precautions, NPO status , Patient's Chart, lab work & pertinent test results  History of Anesthesia Complications Negative for: history of anesthetic complications  Airway Mallampati: I  TM Distance: >3 FB Neck ROM: Full    Dental  (+) Dental Advisory Given, Missing   Pulmonary COPD,  COPD inhaler   breath sounds clear to auscultation       Cardiovascular hypertension, Pt. on medications (-) angina  Rhythm:Regular Rate:Normal     Neuro/Psych glaucoma    GI/Hepatic Neg liver ROS,GERD  Controlled,,  Endo/Other  diabetes (glu 160), Oral Hypoglycemic Agents, Insulin Dependent    Renal/GU negative Renal ROS   Prostate cancer    Musculoskeletal  (+) Arthritis ,    Abdominal   Peds  Hematology negative hematology ROS (+)   Anesthesia Other Findings   Reproductive/Obstetrics                             Anesthesia Physical Anesthesia Plan  ASA: 3  Anesthesia Plan: General   Post-op Pain Management: Tylenol PO (pre-op)*   Induction: Intravenous  PONV Risk Score and Plan: 2 and Ondansetron and Dexamethasone  Airway Management Planned: Oral ETT  Additional Equipment: None  Intra-op Plan:   Post-operative Plan: Extubation in OR  Informed Consent: I have reviewed the patients History and Physical, chart, labs and discussed the procedure including the risks, benefits and alternatives for the proposed anesthesia with the patient or authorized representative who has indicated his/her understanding and acceptance.     Dental advisory given  Plan Discussed with: CRNA and Surgeon  Anesthesia Plan Comments:         Anesthesia Quick Evaluation

## 2023-09-18 NOTE — Progress Notes (Signed)
  Radiation Oncology         [phone] ________________________________  Name: Alex Sanchez MRN: [id number]  Date: 09/18/2023  DOB: December 28, 1945       Prostate Seed Implant  CC:Marylynn Pearson, FNP  No ref. provider found  DIAGNOSIS:  77 y.o. gentleman with Stage T2a adenocarcinoma of the prostate with Gleason score of 3+4, and PSA of 5.8.   Oncology History  Malignant neoplasm of prostate (HCC)  03/18/2023 Cancer Staging   Staging form: Prostate, AJCC 8th Edition - Clinical stage from 03/18/2023: Stage IIB (cT2a, cN0, cM0, PSA: 5.8, Grade Group: 2) - Signed by Marcello Fennel, PA-C on 06/04/2023 Histopathologic type: Adenocarcinoma, NOS Stage prefix: Initial diagnosis Prostate specific antigen (PSA) range: Less than 10 Gleason primary pattern: 3 Gleason secondary pattern: 4 Gleason score: 7 Histologic grading system: 5 grade system Number of biopsy cores examined: 12 Number of biopsy cores positive: 2 Location of positive needle core biopsies: One side   06/04/2023 Initial Diagnosis   Malignant neoplasm of prostate (HCC)       ICD-10-CM   1. Malignant neoplasm of prostate (HCC)  C61 CBG per Guidelines for Diabetes Management for Patients Undergoing Surgery ([facility], [facility], and [facility] only)    CBG per protocol    I-Stat, Chem 8 on day of surgery per protocol      PROCEDURE: Insertion of radioactive I-125 seeds into the prostate gland.  RADIATION DOSE: 145 Gy, definitive therapy.  TECHNIQUE: Alex Sanchez was brought to the operating room with the urologist. He was placed in the dorsolithotomy position. He was catheterized and a rectal tube was inserted. The perineum was shaved, prepped and draped. The ultrasound probe was then introduced by me into the rectum to see the prostate gland.  TREATMENT DEVICE: I attached the needle grid to the ultrasound probe stand and anchor needles were placed.  3D PLANNING: The prostate was imaged in 3D using a sagittal sweep of the prostate probe.  These images were transferred to the planning computer. There, the prostate, urethra and rectum were defined on each axial reconstructed image. Then, the software created an optimized 3D plan and a few seed positions were adjusted. The quality of the plan was reviewed using [name] information for the target and the following two organs at risk:  Urethra and Rectum.  Then the accepted plan was printed and handed off to the radiation therapist.  Under my supervision, the custom loading of the seeds and spacers was carried out using the quick loader.  These pre-loaded needles were then placed into the needle holder.Marland Kitchen  PROSTATE VOLUME STUDY:  Using transrectal ultrasound the volume of the prostate was verified to be 45.9 cc.  SPECIAL TREATMENT PROCEDURE/SUPERVISION AND HANDLING: The pre-loaded needles were then delivered by the urologist under sagittal guidance. A total of 15 needles were used to deposit 65 seeds in the prostate gland. The individual seed activity was 0.505 mCi.  SpaceOAR:  Yes  COMPLEX SIMULATION: At the end of the procedure, an anterior radiograph of the pelvis was obtained to document seed positioning and count. Cystoscopy was performed by the urologist to check the urethra and bladder.  MICRODOSIMETRY: At the end of the procedure, the patient was emitting 0.077 mR/hr at 1 meter. Accordingly, he was considered safe for hospital discharge.  PLAN: The patient will return to the radiation oncology clinic for post implant CT dosimetry in three weeks.   ________________________________  Artist Pais Kathrynn Running, M.D.

## 2023-09-18 NOTE — Discharge Instructions (Addendum)

## 2023-09-18 NOTE — Anesthesia Procedure Notes (Signed)
Procedure Name: Intubation Date/Time: 09/18/2023 11:49 AM  Performed by: Dairl Ponder, CRNAPre-anesthesia Checklist: Patient identified, Emergency Drugs available, Suction available and Patient being monitored Patient Re-evaluated:Patient Re-evaluated prior to induction Oxygen Delivery Method: Circle System Utilized Preoxygenation: Pre-oxygenation with 100% oxygen Induction Type: IV induction Ventilation: Mask ventilation without difficulty Laryngoscope Size: Mac and 4 Grade View: Grade I Tube type: Oral Tube size: 7.5 mm Number of attempts: 1 Airway Equipment and Method: Stylet and Oral airway Placement Confirmation: ETT inserted through vocal cords under direct vision, positive ETCO2 and breath sounds checked- equal and bilateral Secured at: 24 cm Tube secured with: Tape Dental Injury: Teeth and Oropharynx as per pre-operative assessment

## 2023-09-22 ENCOUNTER — Encounter (HOSPITAL_BASED_OUTPATIENT_CLINIC_OR_DEPARTMENT_OTHER): Payer: Self-pay | Admitting: Urology

## 2023-09-25 NOTE — Progress Notes (Signed)
RN left message for call back.  

## 2023-10-06 ENCOUNTER — Telehealth: Payer: Self-pay | Admitting: *Deleted

## 2023-10-06 NOTE — Telephone Encounter (Signed)
CALLED PATIENT TO REMIND OF POST SEED APPTS. FOR 10-07-23, SPOKE WITH PATIENT AND HE IS AWARE OF THESE APPTS.

## 2023-10-07 ENCOUNTER — Encounter: Payer: Self-pay | Admitting: Urology

## 2023-10-07 ENCOUNTER — Ambulatory Visit
Admission: RE | Admit: 2023-10-07 | Discharge: 2023-10-07 | Discharge status: Home / Self Care | Payer: Medicare HMO | Source: Ambulatory Visit | Attending: Urology | Admitting: Urology

## 2023-10-07 ENCOUNTER — Ambulatory Visit
Admission: RE | Admit: 2023-10-07 | Discharge: 2023-10-07 | Disposition: A | Discharge status: Home / Self Care | Payer: Medicare HMO | Source: Ambulatory Visit | Attending: Radiation Oncology | Admitting: Radiation Oncology

## 2023-10-07 VITALS — BP 155/77 | HR 81 | Temp 97.6°F | Resp 20 | Ht 72.0 in | Wt 200.6 lb

## 2023-10-07 DIAGNOSIS — C61 Malignant neoplasm of prostate: Secondary | ICD-10-CM | POA: Diagnosis present

## 2023-10-07 NOTE — Progress Notes (Signed)
  Radiation Oncology         [phone] ________________________________  Name: Alex Sanchez MRN: [id number]  Date: 10/07/2023  DOB: 1946/07/19  COMPLEX SIMULATION NOTE  NARRATIVE:  The patient was brought to the CT Simulation planning suite today following prostate seed implantation approximately one month ago.  Identity was confirmed.  All relevant records and images related to the planned course of therapy were reviewed.  Then, the patient was set-up supine.  CT images were obtained.  The CT images were loaded into the planning software.  Then the prostate and rectum were contoured.  Treatment planning then occurred.  The implanted iodine 125 seeds were identified by the physics staff for projection of radiation distribution  I have requested : 3D Simulation  I have requested a DVH of the following structures: Prostate and rectum.    ________________________________  Alex Sanchez, M.D.

## 2023-10-07 NOTE — Progress Notes (Signed)
Radiation Oncology         [phone] ________________________________  Name: Alex Sanchez MRN: [id number]  Date: 10/07/2023  DOB: 1946/03/17  Post-Seed Follow-Up Visit Note  CC: Marylynn Pearson, FNP  Bjorn Pippin, MD  Diagnosis:   77 y.o. gentleman with Stage T2a adenocarcinoma of the prostate with Gleason score of 3+4, and PSA of 5.8.     ICD-10-CM   1. Malignant neoplasm of prostate (HCC)  C61       Interval Since Last Radiation:  3 weeks 09/18/23:  Insertion of radioactive I-125 seeds into the prostate gland; 145 Gy, definitive therapy with placement of SpaceOAR gel.  Narrative:  The patient returns today for routine follow-up.  He is complaining of increased urinary frequency and urinary hesitation symptoms. He filled out a questionnaire regarding urinary function today providing and overall IPSS score of 23 characterizing his symptoms as severe despite continuing to take the Flomax and finasteride daily as prescribed.  His pre-implant score was 16 on Flomax and Finasteride.  His most bothersome LUTS are the frequency, urgency and occasional urge incontinence.  He denies dysuria, gross hematuria, fever or chills.  He denies any abdominal pain or bowel symptoms.  He has noticed some fullness in the left groin since the time of his procedure.  He denies any pain associated with the fullness but is concerned since this is new.  ALLERGIES:  is allergic to codeine.  Meds: Current Outpatient Medications  Medication Sig Dispense Refill   ACCU-CHEK AVIVA PLUS test strip 1 each 2 (two) times daily.     Accu-Chek Softclix Lancets lancets USE TO CHECK BLOOD SUGAR TWICE DAILY     aspirin 81 MG chewable tablet Chew 81 mg by mouth daily.     atorvastatin (LIPITOR) 20 MG tablet Take 20 mg by mouth every evening.     B-D ULTRAFINE III SHORT PEN 31G X 8 MM MISC Inject 1 each into the skin 3 (three) times daily.     cholecalciferol (VITAMIN D3) 25 MCG (1000 UNIT) tablet Take 1,000 Units by  mouth daily.     Continuous Glucose Receiver (FREESTYLE LIBRE 3 READER) DEVI      Continuous Glucose Sensor (FREESTYLE LIBRE 3 SENSOR) MISC      dorzolamide-timolol (COSOPT) 2-0.5 % ophthalmic solution Place 1 drop into both eyes 2 (two) times daily.     finasteride (PROSCAR) 5 MG tablet Take 1 tablet by mouth daily.     fluticasone (FLONASE) 50 MCG/ACT nasal spray Place 2 sprays into both nostrils daily.     glipiZIDE (GLUCOTROL XL) 5 MG 24 hr tablet Take 10 mg by mouth 2 (two) times daily.  Takes 2 of 10 mg bid     HYDROcodone-acetaminophen (NORCO/VICODIN) 5-325 MG tablet Take 2 tablets by mouth every 6 (six) hours as needed for moderate pain (pain score 4-6). 6 tablet 0   insulin glargine (LANTUS) 100 UNIT/ML Solostar Pen Inject 10 units of insulin in the morning and 5 units before bedtime (Patient taking differently: Inject 20 units of insulin in the morning and 9 units before bedtime) 15 mL 3   latanoprost (XALATAN) 0.005 % ophthalmic solution 1 drop at bedtime.     losartan (COZAAR) 25 MG tablet Take 25 mg by mouth daily.     metFORMIN (GLUCOPHAGE) 1000 MG tablet Take 1,000 mg by mouth 2 (two) times daily.     montelukast (SINGULAIR) 10 MG tablet Take 10 mg by mouth daily.     Multiple Vitamins-Minerals (MULTIVITAMIN  WITH MINERALS) tablet Take 1 tablet by mouth daily.     pioglitazone (ACTOS) 30 MG tablet Take 30 mg by mouth every evening.     Safety Seal Miscellaneous MISC Please fill for Lantus Solostar pen needles 1 box 1 Units 0   tamsulosin (FLOMAX) 0.4 MG CAPS capsule Take 0.4 mg by mouth every evening.     triamcinolone cream (KENALOG) 0.1 % Apply to affected area twice a day as needed for rash (Patient not taking: Reported on 08/05/2023)     No current facility-administered medications for this encounter.    Physical Findings: In general this is a well appearing African American male in no acute distress. He's alert and oriented x4 and appropriate throughout the examination.  Cardiopulmonary assessment is negative for acute distress and he exhibits normal effort.   Lab Findings: Lab Results  Component Value Date   WBC 4.2 01/01/2022   HGB 12.9 (L) 09/18/2023   HCT 38.0 (L) 09/18/2023   MCV 93.9 01/01/2022   PLT 238 01/01/2022    Radiographic Findings:  Patient underwent CT imaging in our clinic for post implant dosimetry. The CT will be reviewed by Dr. Kathrynn Running to confirm there is an adequate distribution of radioactive seeds throughout the prostate gland and ensure that there are no seeds in or near the rectum.  We suspect the final radiation plan and dosimetry will show appropriate coverage of the prostate gland. He understands that we will call and inform him of any unexpected findings on further review of his imaging and dosimetry.  Impression/Plan: 77 y.o. gentleman with Stage T2a adenocarcinoma of the prostate with Gleason score of 3+4, and PSA of 5.8.  The patient is recovering from the effects of radiation. His urinary symptoms should gradually improve over the next 4-6 months. We talked about this today. He is encouraged by his improvement already and is otherwise pleased with his outcome. We also talked about long-term follow-up for prostate cancer following seed implant. He understands that ongoing PSA determinations and digital rectal exams will help perform surveillance to rule out disease recurrence. He has a follow up appointment scheduled with Anne Fu, NP on 10/16/23 and we will have him evaluate the left groin/inguinal fullness to rule out inguinal hernia.  He will then see Dr. Annabell Howells in 12/2023 for his first post-treatment PSA and understands what to expect with his PSA measures. Patient was also educated today about some of the long-term effects from radiation including a small risk for rectal bleeding and possibly erectile dysfunction. We talked about some of the general management approaches to these potential complications. However, I did encourage  the patient to contact our office or return at any point if he has questions or concerns related to his previous radiation and prostate cancer.    Marguarite Arbour, PA-C

## 2023-10-07 NOTE — Progress Notes (Signed)
Post-seed nursing interview for a diagnosis of 77 y.o. gentleman with Stage T2a adenocarcinoma of the prostate with Gleason score of 3+4, and PSA of 5.8.  Patient identity verified x2.   Patient reports polyuria, nocturia, urgency, incontinence, and severe ED. Patient denies all other related issues at this time.  Meaningful use complete.  I-PSS score- 23 - Severe SHIM score- 1- No sexual activity within the last 6 months Urinary Management medication(s) Tamsulosin Urology appointment date- 10/16/2023 with Dr. Annabell Howells at [facility]  Vitals- BP (!) 155/77 (BP Location: Left Arm, Patient Position: Sitting, Cuff Size: Normal)   Pulse 81   Temp 97.6 F (36.4 C) (Temporal)   Resp 20   Ht 6' (1.829 m)   Wt 200 lb 9.6 oz (91 kg)   SpO2 100%   BMI 27.21 kg/m   This concludes the interaction.  Alex Favors, LPN

## 2023-10-12 ENCOUNTER — Telehealth: Payer: Self-pay | Admitting: *Deleted

## 2023-10-12 NOTE — Telephone Encounter (Signed)
Returned patient's phone call, spoke with patient 

## 2023-10-13 ENCOUNTER — Telehealth: Payer: Self-pay

## 2023-10-13 ENCOUNTER — Encounter: Payer: Self-pay | Admitting: *Deleted

## 2023-10-13 NOTE — Progress Notes (Signed)
Called pt to schedule [facility] appt in January and also called [facility] to let pt know the time and day of his appt w/ Dr. Annabell Howells. Pt verbalized understanding.

## 2023-10-13 NOTE — Telephone Encounter (Signed)
RN spoke to Mr. Dapolito, reports having urinary frequency, nocturia x7, urine start/stopping, slight burning with urination, and back pain but states that's not anything new 2/10 on pain scale.  Denies hematuria, odorous smell, and/or pelvic pain.  He reports is taking Tamsulosin 1 tablet daily and finasteride 1 tablet daily.  Mr. Mangino also reports will be seeing the urologist 10/14/2023 moved up from 10/16/2023.  I suggested he increase water intake and if he feels like his retaining urine more than his putting out to go to ED right away otherwise go to his appointment with Urologist tomorrow as scheduled.  Ashlyn Bruning, PA-C made aware.

## 2023-10-13 NOTE — Telephone Encounter (Signed)
-----   Message from [place] sent at 10/12/2023  5:57 PM EST ----- Regarding: FW: phone call Marisue Ivan, In Vontae Court's absence, will you please call Mr. Vetrano to follow up and confirm that he has continued taking the tamsulosin and finasteride. If yes, please assess whether there is any concern for UTI or acute urinary retention. If sounds like UTI, we can order u/a with C&S here at the cancer center and I can call him with the results. If concern for retention, advise him to seek evaluation in the ED unless he can be seen In the urology office sooner than his scheduled f/u visit 10/16/23. When I saw him for his post-seed visit last week, he was having frequency/urgency but no dysuria and felt like things were gradually improving... sounds like he may be back-sliding. :( -Ashlyn ----- Message ----- From: Lolita Cram Sent: 10/12/2023  12:06 PM EST To: Marcello Fennel, PA-C; Roel Cluck, RN Subject: phone call                                     Algis Liming,   This patient has called and is complaining of urine frequency, he is asking to speak with you.  Mr. Battenfield phone number is [phone].  I have asked him to call Dr. Belva Crome Office.  Talbert Forest

## 2023-10-15 ENCOUNTER — Encounter: Payer: Self-pay | Admitting: Radiation Oncology

## 2023-10-16 ENCOUNTER — Ambulatory Visit
Admission: RE | Admit: 2023-10-16 | Discharge: 2023-10-16 | Disposition: A | Discharge status: Home / Self Care | Payer: Medicare HMO | Source: Ambulatory Visit | Attending: Radiation Oncology | Admitting: Radiation Oncology

## 2023-10-16 DIAGNOSIS — C61 Malignant neoplasm of prostate: Secondary | ICD-10-CM | POA: Insufficient documentation

## 2023-10-16 NOTE — Progress Notes (Signed)
  Radiation Oncology         [phone] ________________________________  Name: Alex Sanchez MRN: [id number]  Date: 10/15/2023  DOB: 12/27/1945  3D Planning Note   Prostate Brachytherapy Post-Implant Dosimetry  Diagnosis: 77 y.o. gentleman with Stage T2a adenocarcinoma of the prostate with Gleason score of 3+4, and PSA of 5.8.   Narrative: On a previous date, Alex Sanchez returned following prostate seed implantation for post implant planning. He underwent CT scan complex simulation to delineate the three-dimensional structures of the pelvis and demonstrate the radiation distribution.  Since that time, the seed localization, and complex isodose planning with dose volume histograms have now been completed.  Results:   Prostate Coverage - The dose of radiation delivered to the 90% or more of the prostate gland (D90) was 116.64% of the prescription dose. This exceeds our goal of greater than 90%. Rectal Sparing - The volume of rectal tissue receiving the prescription dose or higher was 0.01 cc. This falls under our thresholds tolerance of 1.0 cc.  Impression: The prostate seed implant appears to show adequate target coverage and appropriate rectal sparing.  Plan:  The patient will continue to follow with urology for ongoing PSA determinations. I would anticipate a high likelihood for local tumor control with minimal risk for rectal morbidity.  ________________________________  Artist Pais Kathrynn Running, M.D.

## 2023-10-16 NOTE — Radiation Completion Notes (Addendum)
  Radiation Oncology         [phone] ________________________________  Name: Alex Sanchez MRN: [id number]  Date: 10/15/2023  DOB: 08-Aug-1946  Referring Physician: NORLEEN SELTZER, M.D. Date of Service: 2023-10-16 Radiation Oncologist: Adina Barge, M.D. [facility]     RADIATION ONCOLOGY END OF TREATMENT NOTE     Diagnosis: 77 y.o. gentleman with Stage T2a adenocarcinoma of the prostate with Gleason score of 3+4, and PSA of 5.8.   Intent: Curative     ==========DELIVERED PLANS==========  Prostate Seed Implant Date: 2023-09-18   Plan Name: Prostate Seed Implant Site: Prostate Technique: Radioactive Seed Implant I-125 Mode: Brachytherapy Dose Per Fraction: 145 Gy Prescribed Dose (Delivered / Prescribed): 145 Gy / 145 Gy Prescribed Fxs (Delivered / Prescribed): 1 / 1     ==========ON TREATMENT VISIT DATES========== 2023-09-18   The patient will receive a call in about one month from the radiation oncology department. He will continue follow up with Dr. SELTZER as well.  ------------------------------------------------   Donnice Barge, MD [facility]  Radiation Oncology Direct Dial: [phone]  Fax: [phone] [email or web address]  Skype  LinkedIn

## 2023-12-03 ENCOUNTER — Inpatient Hospital Stay: Payer: Medicare HMO | Admitting: *Deleted

## 2023-12-04 ENCOUNTER — Encounter: Payer: Self-pay | Admitting: *Deleted

## 2023-12-04 ENCOUNTER — Inpatient Hospital Stay: Payer: Medicare HMO | Attending: Adult Health | Admitting: *Deleted

## 2023-12-04 DIAGNOSIS — C61 Malignant neoplasm of prostate: Secondary | ICD-10-CM

## 2023-12-04 NOTE — Progress Notes (Signed)
SCP reviewed and completed. Vaccinations are UTD. Post-tx PSA labs are 01/05/2024 at 10:45.Appt with Dr.Wrenn is 01/13/2024 at 10:45. Pt has been notified of appts.

## 2024-01-25 ENCOUNTER — Ambulatory Visit: Payer: Self-pay | Admitting: Surgery

## 2024-02-25 ENCOUNTER — Telehealth: Payer: Self-pay | Admitting: Radiation Oncology

## 2024-02-25 ENCOUNTER — Telehealth: Payer: Self-pay

## 2024-02-25 NOTE — Telephone Encounter (Signed)
 RN returning call to patient who had some complaints of urinary frequency, slight burning with urination.  Denies fever, chills, and hematuria.  Also reports his elevation blood glucose. Patient did report his PCP had address that by increasing his insulin dose.  RN advised patient to take over-the-counter medication AZO and increase water intake for few days if not resolved to reach out to Dr. Autry Wrenn (urologist) or call us  back if unable to speak with him.

## 2024-02-25 NOTE — Telephone Encounter (Signed)
 Alex Sanchez called in to schedule with a provider for Alex Sanchez. After looking in the past appts, I did not see an appointment with Dr. Alita Irwin. Alex Sanchez mentioned that [name]'s sugars are high and she is trying to find out why. I transferred [name] to Dr. Arletta Bender nurse line.

## 2024-02-27 ENCOUNTER — Emergency Department (HOSPITAL_COMMUNITY)

## 2024-02-27 ENCOUNTER — Inpatient Hospital Stay (HOSPITAL_COMMUNITY)
Admission: EM | Admit: 2024-02-27 | Discharge: 2024-03-02 | DRG: 871 | Disposition: A | Discharge status: Home / Self Care | Attending: Family Medicine | Admitting: Family Medicine

## 2024-02-27 ENCOUNTER — Other Ambulatory Visit: Payer: Self-pay

## 2024-02-27 ENCOUNTER — Encounter (HOSPITAL_COMMUNITY): Payer: Self-pay

## 2024-02-27 DIAGNOSIS — Z794 Long term (current) use of insulin: Secondary | ICD-10-CM

## 2024-02-27 DIAGNOSIS — E872 Acidosis, unspecified: Secondary | ICD-10-CM | POA: Diagnosis present

## 2024-02-27 DIAGNOSIS — G9341 Metabolic encephalopathy: Secondary | ICD-10-CM | POA: Diagnosis present

## 2024-02-27 DIAGNOSIS — A419 Sepsis, unspecified organism: Principal | ICD-10-CM | POA: Diagnosis present

## 2024-02-27 DIAGNOSIS — E1165 Type 2 diabetes mellitus with hyperglycemia: Secondary | ICD-10-CM | POA: Diagnosis present

## 2024-02-27 DIAGNOSIS — R3915 Urgency of urination: Secondary | ICD-10-CM | POA: Diagnosis present

## 2024-02-27 DIAGNOSIS — I10 Essential (primary) hypertension: Secondary | ICD-10-CM | POA: Diagnosis present

## 2024-02-27 DIAGNOSIS — B964 Proteus (mirabilis) (morganii) as the cause of diseases classified elsewhere: Secondary | ICD-10-CM | POA: Diagnosis present

## 2024-02-27 DIAGNOSIS — I2510 Atherosclerotic heart disease of native coronary artery without angina pectoris: Secondary | ICD-10-CM | POA: Diagnosis present

## 2024-02-27 DIAGNOSIS — N400 Enlarged prostate without lower urinary tract symptoms: Secondary | ICD-10-CM | POA: Diagnosis present

## 2024-02-27 DIAGNOSIS — E785 Hyperlipidemia, unspecified: Secondary | ICD-10-CM | POA: Diagnosis present

## 2024-02-27 DIAGNOSIS — Z7982 Long term (current) use of aspirin: Secondary | ICD-10-CM

## 2024-02-27 DIAGNOSIS — Z885 Allergy status to narcotic agent status: Secondary | ICD-10-CM | POA: Diagnosis not present

## 2024-02-27 DIAGNOSIS — Z8546 Personal history of malignant neoplasm of prostate: Secondary | ICD-10-CM | POA: Diagnosis not present

## 2024-02-27 DIAGNOSIS — R410 Disorientation, unspecified: Secondary | ICD-10-CM

## 2024-02-27 DIAGNOSIS — Z79899 Other long term (current) drug therapy: Secondary | ICD-10-CM

## 2024-02-27 DIAGNOSIS — N3000 Acute cystitis without hematuria: Secondary | ICD-10-CM | POA: Diagnosis present

## 2024-02-27 DIAGNOSIS — Z7984 Long term (current) use of oral hypoglycemic drugs: Secondary | ICD-10-CM

## 2024-02-27 DIAGNOSIS — D72819 Decreased white blood cell count, unspecified: Secondary | ICD-10-CM | POA: Diagnosis present

## 2024-02-27 DIAGNOSIS — R531 Weakness: Secondary | ICD-10-CM

## 2024-02-27 DIAGNOSIS — N3090 Cystitis, unspecified without hematuria: Secondary | ICD-10-CM | POA: Diagnosis not present

## 2024-02-27 LAB — HEPATIC FUNCTION PANEL
ALT: 16 U/L (ref 0–44)
AST: 26 U/L (ref 15–41)
Albumin: 3 g/dL — ABNORMAL LOW (ref 3.5–5.0)
Alkaline Phosphatase: 66 U/L (ref 38–126)
Bilirubin, Direct: 0.2 mg/dL (ref 0.0–0.2)
Indirect Bilirubin: 0.6 mg/dL (ref 0.3–0.9)
Total Bilirubin: 0.8 mg/dL (ref 0.0–1.2)
Total Protein: 6.8 g/dL (ref 6.5–8.1)

## 2024-02-27 LAB — CBC
HCT: 36.6 % — ABNORMAL LOW (ref 39.0–52.0)
Hemoglobin: 11.2 g/dL — ABNORMAL LOW (ref 13.0–17.0)
MCH: 27.1 pg (ref 26.0–34.0)
MCHC: 30.6 g/dL (ref 30.0–36.0)
MCV: 88.6 fL (ref 80.0–100.0)
Platelets: 323 10*3/uL (ref 150–400)
RBC: 4.13 MIL/uL — ABNORMAL LOW (ref 4.22–5.81)
RDW: 13.9 % (ref 11.5–15.5)
WBC: 3.3 10*3/uL — ABNORMAL LOW (ref 4.0–10.5)
nRBC: 0 % (ref 0.0–0.2)

## 2024-02-27 LAB — CBG MONITORING, ED: Glucose-Capillary: 198 mg/dL — ABNORMAL HIGH (ref 70–99)

## 2024-02-27 LAB — BASIC METABOLIC PANEL WITH GFR
Anion gap: 9 (ref 5–15)
BUN: 13 mg/dL (ref 8–23)
CO2: 25 mmol/L (ref 22–32)
Calcium: 8.6 mg/dL — ABNORMAL LOW (ref 8.9–10.3)
Chloride: 100 mmol/L (ref 98–111)
Creatinine, Ser: 1 mg/dL (ref 0.61–1.24)
GFR, Estimated: >60 mL/min (ref >60)
Glucose, Bld: 247 mg/dL — ABNORMAL HIGH (ref 70–99)
Potassium: 3.7 mmol/L (ref 3.5–5.1)
Sodium: 134 mmol/L — ABNORMAL LOW (ref 135–145)

## 2024-02-27 LAB — URINALYSIS, ROUTINE W REFLEX MICROSCOPIC
Bacteria, UA: NONE SEEN
Bilirubin Urine: NEGATIVE
Glucose, UA: 150 mg/dL — AB
Ketones, ur: 5 mg/dL — AB
Nitrite: POSITIVE — AB
Protein, ur: >=300 mg/dL — AB
Specific Gravity, Urine: 1.016 (ref 1.005–1.030)
WBC, UA: >50 WBC/hpf (ref 0–5)
pH: 8 (ref 5.0–8.0)

## 2024-02-27 LAB — LIPASE, BLOOD: Lipase: 361 U/L — ABNORMAL HIGH (ref 11–51)

## 2024-02-27 LAB — I-STAT CG4 LACTIC ACID, ED
Lactic Acid, Venous: 1.1 mmol/L (ref 0.5–1.9)
Lactic Acid, Venous: 2.2 mmol/L (ref 0.5–1.9)

## 2024-02-27 LAB — GLUCOSE, CAPILLARY: Glucose-Capillary: 307 mg/dL — ABNORMAL HIGH (ref 70–99)

## 2024-02-27 MED ORDER — INSULIN ASPART 100 UNIT/ML IJ SOLN
0.0000 [IU] | Freq: Three times a day (TID) | INTRAMUSCULAR | Status: DC
Start: 2024-02-28 — End: 2024-03-02
  Administered 2024-02-28: 8 [IU] via SUBCUTANEOUS
  Administered 2024-02-28: 2 [IU] via SUBCUTANEOUS
  Administered 2024-02-28: 12 [IU] via SUBCUTANEOUS
  Administered 2024-02-29: 8 [IU] via SUBCUTANEOUS
  Administered 2024-02-29: 4 [IU] via SUBCUTANEOUS
  Administered 2024-03-01: 2 [IU] via SUBCUTANEOUS
  Administered 2024-03-01: 4 [IU] via SUBCUTANEOUS
  Administered 2024-03-01: 12 [IU] via SUBCUTANEOUS
  Administered 2024-03-02: 8 [IU] via SUBCUTANEOUS
  Filled 2024-02-27: qty 0.24

## 2024-02-27 MED ORDER — SODIUM CHLORIDE 0.9 % IV SOLN
2.0000 g | INTRAVENOUS | Status: DC
  Administered 2024-02-28 – 2024-02-29 (×2): 2 g via INTRAVENOUS
  Filled 2024-02-27 (×2): qty 20

## 2024-02-27 MED ORDER — INSULIN GLARGINE-YFGN 100 UNIT/ML ~~LOC~~ SOLN
25.0000 [IU] | Freq: Every day | SUBCUTANEOUS | Status: DC
  Administered 2024-02-27: 25 [IU] via SUBCUTANEOUS
  Filled 2024-02-27 (×3): qty 0.25

## 2024-02-27 MED ORDER — ASPIRIN 81 MG PO CHEW
81.0000 mg | CHEWABLE_TABLET | Freq: Every day | ORAL | Status: DC
Start: 2024-02-27 — End: 2024-03-02
  Administered 2024-02-27 – 2024-03-02 (×5): 81 mg via ORAL
  Filled 2024-02-27 (×5): qty 1

## 2024-02-27 MED ORDER — IOHEXOL 300 MG/ML  SOLN
100.0000 mL | Freq: Once | INTRAMUSCULAR | Status: AC | PRN
  Administered 2024-02-27: 100 mL via INTRAVENOUS

## 2024-02-27 MED ORDER — INSULIN GLARGINE-YFGN 100 UNIT/ML ~~LOC~~ SOLN
10.0000 [IU] | Freq: Once | SUBCUTANEOUS | Status: AC
  Administered 2024-02-27: 10 [IU] via SUBCUTANEOUS
  Filled 2024-02-27: qty 0.1

## 2024-02-27 MED ORDER — OXYCODONE HCL 5 MG PO TABS: 5.0000 mg | ORAL_TABLET | ORAL | Status: DC | PRN

## 2024-02-27 MED ORDER — ENOXAPARIN SODIUM 40 MG/0.4ML IJ SOSY
40.0000 mg | PREFILLED_SYRINGE | Freq: Every day | INTRAMUSCULAR | Status: DC
  Administered 2024-02-27 – 2024-03-01 (×4): 40 mg via SUBCUTANEOUS
  Filled 2024-02-27 (×4): qty 0.4

## 2024-02-27 MED ORDER — LACTATED RINGERS IV BOLUS
1000.0000 mL | Freq: Once | INTRAVENOUS | Status: AC
  Administered 2024-02-27: 1000 mL via INTRAVENOUS

## 2024-02-27 MED ORDER — FINASTERIDE 5 MG PO TABS
5.0000 mg | ORAL_TABLET | Freq: Every day | ORAL | Status: DC
  Administered 2024-02-27 – 2024-03-02 (×5): 5 mg via ORAL
  Filled 2024-02-27 (×5): qty 1

## 2024-02-27 MED ORDER — SODIUM CHLORIDE 0.9 % IV SOLN
1.0000 g | Freq: Once | INTRAVENOUS | Status: AC
Start: 2024-02-27 — End: 2024-02-27
  Administered 2024-02-27: 1 g via INTRAVENOUS
  Filled 2024-02-27: qty 10

## 2024-02-27 MED ORDER — ONDANSETRON HCL 4 MG PO TABS
4.0000 mg | ORAL_TABLET | Freq: Four times a day (QID) | ORAL | Status: DC | PRN
Start: 2024-02-27 — End: 2024-03-02

## 2024-02-27 MED ORDER — ACETAMINOPHEN 650 MG RE SUPP: 650.0000 mg | Freq: Four times a day (QID) | RECTAL | Status: DC | PRN

## 2024-02-27 MED ORDER — TAMSULOSIN HCL 0.4 MG PO CAPS
0.4000 mg | ORAL_CAPSULE | Freq: Two times a day (BID) | ORAL | Status: DC
  Administered 2024-02-27 – 2024-03-02 (×8): 0.4 mg via ORAL
  Filled 2024-02-27 (×8): qty 1

## 2024-02-27 MED ORDER — LACTATED RINGERS IV BOLUS
500.0000 mL | Freq: Once | INTRAVENOUS | Status: AC
  Administered 2024-02-27: 500 mL via INTRAVENOUS

## 2024-02-27 MED ORDER — LOSARTAN POTASSIUM 25 MG PO TABS
25.0000 mg | ORAL_TABLET | Freq: Once | ORAL | Status: AC
  Administered 2024-02-27: 25 mg via ORAL
  Filled 2024-02-27: qty 1

## 2024-02-27 MED ORDER — LACTATED RINGERS IV SOLN: INTRAVENOUS | Status: DC

## 2024-02-27 MED ORDER — LOSARTAN POTASSIUM 25 MG PO TABS
25.0000 mg | ORAL_TABLET | Freq: Every day | ORAL | Status: DC
  Administered 2024-02-28 – 2024-03-02 (×4): 25 mg via ORAL
  Filled 2024-02-27 (×4): qty 1

## 2024-02-27 MED ORDER — ONDANSETRON HCL 4 MG/2ML IJ SOLN: 4.0000 mg | Freq: Four times a day (QID) | INTRAMUSCULAR | Status: DC | PRN

## 2024-02-27 MED ORDER — ATORVASTATIN CALCIUM 20 MG PO TABS
20.0000 mg | ORAL_TABLET | Freq: Every evening | ORAL | Status: DC
  Administered 2024-02-27 – 2024-03-01 (×4): 20 mg via ORAL
  Filled 2024-02-27 (×4): qty 1

## 2024-02-27 MED ORDER — ACETAMINOPHEN 325 MG PO TABS
650.0000 mg | ORAL_TABLET | Freq: Four times a day (QID) | ORAL | Status: DC | PRN
  Administered 2024-02-27 – 2024-02-28 (×2): 650 mg via ORAL
  Filled 2024-02-27 (×2): qty 2

## 2024-02-27 NOTE — ED Notes (Signed)
 One under filled aerobic culture obtained. Due to difficult access.

## 2024-02-27 NOTE — H&P (Signed)
 History and Physical    Alex Sanchez:[id number] DOB: 1945/12/10 DOA: 02/27/2024  PCP: Alex Martinet, FNP   Chief Complaint:  dysuria  HPI: Alex Sanchez is Sanchez 78 y.o. male with medical history significant of prostate cancer, hypertension, hyperlipidemia, type 2 diabetes who presents emergency department due to fatigue, dysuria feeling weak.  He went to his primary care doctor several days ago and was given Azo due to concern for UTI.  Patient was noted to be very somnolent and sleeping most of the day with elements of confusion.  He was brought to the ER for further assessment.  On arrival he was afebrile and hemodynamically stable.  Labs were obtained presentation which showed urinalysis concerning for infection, urine cultures pending, WBC 3.3, hemoglobin 11.2, lactic acid 2.2, creatinine 1.0, glucose 247.  Patient underwent CT abdomen pelvis which showed cystitis.  Patient was started on ceftriaxone  admitted for further workup.  On admission he was resting comfortably endorsing persistent suprapubic tenderness.   Review of Systems: Review of Systems  Constitutional:  Positive for chills, fever and malaise/fatigue.  Eyes: Negative.   Respiratory: Negative.    Cardiovascular: Negative.   Gastrointestinal: Negative.   Genitourinary:  Positive for dysuria, frequency and urgency.  Musculoskeletal: Negative.   Skin: Negative.   Neurological: Negative.   Endo/Heme/Allergies: Negative.   Psychiatric/Behavioral: Negative.    All other systems reviewed and are negative.    As per HPI otherwise 10 point review of systems negative.   Allergies  Allergen Reactions   Codeine Nausea And Vomiting and Other (See Comments)    Past Medical History:  Diagnosis Date   Arthritis    Diabetes mellitus type 2    Elevated PSA    Enlarged prostate    Glaucoma    both eyes   Hyperlipidemia    Hypertension    Nocturia    Prostate cancer (HCC)    Weak urinary stream    Wears glasses      Past Surgical History:  Procedure Laterality Date   BACK SURGERY  10/2002   lower   CATARACT EXTRACTION Left    CYSTOSCOPY  09/18/2023   Procedure: CYSTOSCOPY;  Surgeon: Alex Luster, MD;  Location: [name];  Service: Urology;;   PROSTATE BIOPSY     RADIOACTIVE SEED IMPLANT N/Sanchez 09/18/2023   Procedure: RADIOACTIVE SEED IMPLANT/BRACHYTHERAPY IMPLANT;  Surgeon: Alex Luster, MD;  [facility];  Service: Urology;  Laterality: N/Sanchez;   SPACE OAR INSTILLATION N/Sanchez 09/18/2023   Procedure: SPACE OAR INSTILLATION;  Surgeon: Alex Luster, MD;  Location: [facility];  Service: Urology;  Laterality: N/Sanchez;     reports that he has never smoked. He has never used smokeless tobacco. He reports that he does not drink alcohol and does not use drugs.  Family History  Problem Relation Age of Onset   Asthma Neg Hx     Prior to Admission medications   Medication Sig Start Date End Date Taking? Authorizing Provider  aspirin  81 MG chewable tablet Chew 81 mg by mouth daily.   Yes [provider]  atorvastatin  (LIPITOR) 20 MG tablet Take 20 mg by mouth every evening.   Yes [provider]  cholecalciferol (VITAMIN D3) 25 MCG (1000 UNIT) tablet Take 1,000 Units by mouth daily.   Yes [provider]  dorzolamide-timolol (COSOPT) 2-0.5 % ophthalmic solution Place 1 drop into both eyes 2 (two) times daily. 11/22/22  Yes [provider]  finasteride  (PROSCAR ) 5  MG tablet Take 1 tablet by mouth daily.   Yes [provider]  fluticasone (FLONASE) 50 MCG/ACT nasal spray Place 2 sprays into both nostrils daily. 11/12/21  Yes [provider]  glipiZIDE (GLUCOTROL XL) 5 MG 24 hr tablet Take 10 mg by mouth 2 (two) times daily.  Takes 2 of 10 mg bid   Yes [provider]  insulin  glargine (LANTUS ) 100 UNIT/ML Solostar Pen Inject 10 units of insulin  in the morning and 5 units before bedtime Patient taking  differently: Inject 28 units of insulin  in the morning and 9 units before bedtime 02/05/23  Yes Tedrowe, Michelle A, DO  latanoprost (XALATAN) 0.005 % ophthalmic solution 1 drop at bedtime. 12/14/21  Yes [provider]  losartan  (COZAAR ) 25 MG tablet Take 25 mg by mouth daily. 12/10/21  Yes [provider]  metFORMIN (GLUCOPHAGE) 1000 MG tablet Take 1,000 mg by mouth 2 (two) times daily. 12/05/21  Yes [provider]  Multiple Vitamins-Minerals (MULTIVITAMIN WITH MINERALS) tablet Take 1 tablet by mouth daily.   Yes [provider]  pioglitazone (ACTOS) 30 MG tablet Take 30 mg by mouth every evening.   Yes [provider]  tamsulosin  (FLOMAX ) 0.4 MG CAPS capsule Take 0.4 mg by mouth 2 (two) times daily. Pt takes 2 tablets daily. 12/11/21  Yes [provider]  ACCU-CHEK AVIVA PLUS test strip 1 each 2 (two) times daily. 10/12/21   [provider]  Accu-Chek Softclix Lancets lancets USE TO CHECK BLOOD SUGAR TWICE DAILY 10/05/21   [provider]  B-D ULTRAFINE III SHORT PEN 31G X 8 MM MISC Inject 1 each into the skin 3 (three) times daily. 12/31/21   [provider]  montelukast (SINGULAIR) 10 MG tablet Take 10 mg by mouth daily. Patient not taking: Reported on 02/27/2024 12/31/21   [provider]  Safety Seal Miscellaneous MISC Please fill for Lantus  Solostar pen needles 1 box 02/05/23   Alex Alexander A, DO  triamcinolone cream (KENALOG) 0.1 % Apply to affected area twice Sanchez day as needed for rash Patient not taking: Reported on 08/05/2023    [provider]    Physical Exam: Vitals:   02/27/24 1530 02/27/24 1545 02/27/24 1711 02/27/24 1815  BP: (!) 159/75 (!) 178/77 (!) 182/72 (!) 166/70  Pulse: 91 (!) 117 97 94  Resp: 18 18 18 18   Temp:   99.1 F (37.3 C)   TempSrc:   Oral   SpO2: 96% 99% 94% 96%  Weight:      Height:       Physical Exam Constitutional:      Appearance: He is normal weight.   HENT:     Head: Normocephalic.     Nose: Nose normal.     Mouth/Throat:     Mouth: Mucous membranes are moist.     Pharynx: Oropharynx is clear.  Eyes:     Pupils: Pupils are equal, round, and reactive to light.  Cardiovascular:     Rate and Rhythm: Normal rate and regular rhythm.     Pulses: Normal pulses.  Pulmonary:     Effort: Pulmonary effort is normal.  Abdominal:     General: Abdomen is flat. Bowel sounds are normal.  Musculoskeletal:        General: Normal range of motion.     Cervical back: Normal range of motion.  Skin:    General: Skin is warm.     Capillary Refill: Capillary refill takes less than 2 seconds.  Neurological:     General: No focal deficit present.     Mental Status: He is alert. Mental status is at baseline.  Psychiatric:        Mood and Affect: Mood normal.        Labs on Admission: I have personally reviewed the patients's labs and imaging studies.  Assessment/Plan Principal Problem:   Cystitis   # Sepsis secondary to urinary tract infection - Patient endorsing dysuria abdominal discomfort fatigue and malaise -Lactic acidosis 2.2 - Prior history of prostate cancer  Plan: Continue ceftriaxone  Continue IV fluids  # Type 2 diabetes-patient noted to be hyperglycemic.  He takes 35 units of Lantus  daily.  Will place on 30 units of Lantus  with sliding scale with meals  # CAD-continue aspirin   # Hyperlipidemia-continue Lipitor  # BPH-continue finasteride   # Hypertension-continue losartan   # BPH-continue Flomax , finastride   Admission status: Inpatient Progressive  Certification: The appropriate patient status for this patient is INPATIENT. Inpatient status is judged to be reasonable and necessary in order to provide the required intensity of service to ensure the patient's safety. The patient's presenting symptoms, physical exam findings, and initial radiographic and laboratory data in the context of their chronic comorbidities is  felt to place them at high risk for further clinical deterioration. Furthermore, it is not anticipated that the patient will be medically stable for discharge from the hospital within 2 midnights of admission.   * I certify that at the point of admission it is my clinical judgment that the patient will require inpatient hospital care spanning beyond 2 midnights from the point of admission due to high intensity of service, high risk for further deterioration and high frequency of surveillance required.Myrl Askew MD [facility] If 7PM-7AM, please contact night-coverage [email or web address]  02/27/2024, 7:59 PM

## 2024-02-27 NOTE — ED Notes (Signed)
 ED TO INPATIENT HANDOFF REPORT  Name/Age/Gender Alex Sanchez 78 y.o. male  Code Status    Code Status Orders  (From admission, onward)           Start     Ordered   02/27/24 1957  Full code  Continuous       Question:  By:  Answer:  Consent: discussion documented in EHR   02/27/24 1957           Code Status History     This patient has a current code status but no historical code status.       Home/SNF/Other Home  Chief Complaint Cystitis [N30.90]  Level of Care/Admitting Diagnosis ED Disposition     ED Disposition  Admit   Condition  --   Comment  Hospital [facility]: [facility] [[id number]]  Level of Care: Progressive [102]  Admit to Progressive based on following criteria: NEPHROLOGY stable condition requiring close monitoring for AKI, requiring Hemodialysis or Peritoneal Dialysis either from expected electrolyte imbalance, acidosis, or fluid overload that can be managed by NIPPV or high flow oxygen .  May admit patient to [facility] or [facility] if equivalent level of care is available:: No  Covid Evaluation: Asymptomatic - no recent exposure (last 10 days) testing not required  Diagnosis: Cystitis [409811]  Admitting Physician: Myrl Askew [[id number]]  Attending Physician: Myrl Askew [[id number]]  Certification:: I certify this patient will need inpatient services for at least 2 midnights  Expected Medical Readiness: 02/29/2024          Medical History Past Medical History:  Diagnosis Date   Arthritis    Diabetes mellitus type 2    Elevated PSA    Enlarged prostate    Glaucoma    both eyes   Hyperlipidemia    Hypertension    Nocturia    Prostate cancer (HCC)    Weak urinary stream    Wears glasses     Allergies Allergies  Allergen Reactions   Codeine Nausea And Vomiting and Other (See Comments)    IV Location/Drains/Wounds Patient Lines/Drains/Airways Status     Active Line/Drains/Airways     Name  Placement date Placement time Site Days   Peripheral IV 02/27/24 20 G Anterior;Left;Proximal Forearm 02/27/24  1435  Forearm  less than 1            Labs/Imaging Results for orders placed or performed during the hospital encounter of 02/27/24 (from the past 48 hours)  Urinalysis, Routine w reflex microscopic -Urine, Clean Catch     Status: Abnormal   Collection Time: 02/27/24  1:13 PM  Result Value Ref Range   Color, Urine AMBER (A) YELLOW    Comment: BIOCHEMICALS MAY BE AFFECTED BY COLOR   APPearance TURBID (A) CLEAR   Specific Gravity, Urine 1.016 1.005 - 1.030   pH 8.0 5.0 - 8.0   Glucose, UA 150 (A) NEGATIVE mg/dL   Hgb urine dipstick SMALL (A) NEGATIVE   Bilirubin Urine NEGATIVE NEGATIVE   Ketones, ur 5 (A) NEGATIVE mg/dL   Protein, ur >=657 (A) NEGATIVE mg/dL   Nitrite POSITIVE (A) NEGATIVE   Leukocytes,Ua LARGE (A) NEGATIVE   RBC / HPF 0-5 0 - 5 RBC/hpf   WBC, UA >50 0 - 5 WBC/hpf   Bacteria, UA NONE SEEN NONE SEEN   Squamous Epithelial / HPF 0-5 0 - 5 /HPF   WBC Clumps PRESENT    Non Squamous Epithelial 0-5 (A) NONE SEEN    Comment: Performed  at [facility], [address] [place], [place] [address]  CBC     Status: Abnormal   Collection Time: 02/27/24  2:34 PM  Result Value Ref Range   WBC 3.3 (L) 4.0 - 10.5 K/uL   RBC 4.13 (L) 4.22 - 5.81 MIL/uL   Hemoglobin 11.2 (L) 13.0 - 17.0 g/dL   HCT 84.6 (L) 96.2 - 95.2 %   MCV 88.6 80.0 - 100.0 fL   MCH 27.1 26.0 - 34.0 pg   MCHC 30.6 30.0 - 36.0 g/dL   RDW 84.1 32.4 - 40.1 %   Platelets 323 150 - 400 K/uL   nRBC 0.0 0.0 - 0.2 %    Comment: Performed at [facility], [address], [place], [place] [address]  Basic metabolic panel     Status: Abnormal   Collection Time: 02/27/24  2:34 PM  Result Value Ref Range   Sodium 134 (L) 135 - 145 mmol/L   Potassium 3.7 3.5 - 5.1 mmol/L   Chloride 100 98 - 111 mmol/L   CO2 25 22 - 32 mmol/L   Glucose, Bld 247 (H) 70 - 99 mg/dL     Comment: Glucose reference range applies only to samples taken after fasting for at least 8 hours.   BUN 13 8 - 23 mg/dL   Creatinine, Ser 3.66 0.61 - 1.24 mg/dL   Calcium  8.6 (L) 8.9 - 10.3 mg/dL   GFR, Estimated >44 >03 mL/min    Comment: (NOTE) Calculated using the CKD-EPI Creatinine Equation (2021)    Anion gap 9 5 - 15    Comment: Performed at [facility], [address], [place], [place] [address]  I-Stat Lactic Acid     Status: None   Collection Time: 02/27/24  3:09 PM  Result Value Ref Range   Lactic Acid, Venous 1.1 0.5 - 1.9 mmol/L  CBG monitoring, ED     Status: Abnormal   Collection Time: 02/27/24  4:53 PM  Result Value Ref Range   Glucose-Capillary 198 (H) 70 - 99 mg/dL    Comment: Glucose reference range applies only to samples taken after fasting for at least 8 hours.  I-Stat Lactic Acid     Status: Abnormal   Collection Time: 02/27/24  4:57 PM  Result Value Ref Range   Lactic Acid, Venous 2.2 (HH) 0.5 - 1.9 mmol/L   Comment NOTIFIED PHYSICIAN    CT ABDOMEN PELVIS W CONTRAST Result Date: 02/27/2024 CLINICAL DATA:  Fatigue urinary frequency EXAM: CT ABDOMEN AND PELVIS WITH CONTRAST TECHNIQUE: Multidetector CT imaging of the abdomen and pelvis was performed using the standard protocol following bolus administration of intravenous contrast. RADIATION DOSE REDUCTION: This exam was performed according to the departmental dose-optimization program which includes automated exposure control, adjustment of the mA and/or kV according to patient size and/or use of iterative reconstruction technique. CONTRAST:  OMNIPAQUE  IOHEXOL  300 MG/ML  SOLN COMPARISON:  CT 09/29/2013 FINDINGS: Lower chest: Lung bases demonstrate no acute airspace disease. Hepatobiliary: Hepatic steatosis. No calcified gallstone or biliary dilatation Pancreas: Unremarkable. No pancreatic ductal dilatation or surrounding inflammatory changes. Spleen: Normal in size without focal abnormality.  Adrenals/Urinary Tract: Adrenal glands are normal. Kidneys show no hydronephrosis. Thick-walled urinary bladder with mucosal enhancement and mild perivesical stranding Stomach/Bowel: Stomach nonenlarged. No dilated small bowel. Negative appendix. No acute bowel wall thickening Vascular/Lymphatic: Aortic atherosclerosis. No enlarged abdominal or pelvic lymph nodes. Reproductive: Multiple prostate seeds Other: Negative for pelvic effusion or free air Musculoskeletal:  No acute or suspicious osseous abnormality. Multilevel degenerative changes IMPRESSION: 1. Thick-walled urinary bladder with mucosal enhancement and mild perivesical stranding, findings suggestive of cystitis. 2. Hepatic steatosis. 3. Aortic atherosclerosis. Aortic Atherosclerosis (ICD10-I70.0). Electronically Signed   By: Esmeralda Hedge M.D.   On: 02/27/2024 18:37    Pending Labs Unresulted Labs (From admission, onward)     Start     Ordered   02/28/24 0500  Basic metabolic panel  Tomorrow morning,   R        02/27/24 1957   02/28/24 0500  CBC  Tomorrow morning,   R        02/27/24 1957   02/27/24 1344  Culture, blood (routine x 2)  BLOOD CULTURE X 2,   R (with STAT occurrences)      02/27/24 1354   02/27/24 1344  Hepatic function panel  Once,   URGENT        02/27/24 1354   02/27/24 1344  Lipase, blood  Once,   STAT        02/27/24 1354   02/27/24 1333  Urine Culture  Once,   URGENT       Question:  Indication  Answer:  Dysuria   02/27/24 1332            Vitals/Pain Today's Vitals   02/27/24 1530 02/27/24 1545 02/27/24 1711 02/27/24 1815  BP: (!) 159/75 (!) 178/77 (!) 182/72 (!) 166/70  Pulse: 91 (!) 117 97 94  Resp: 18 18 18 18   Temp:   99.1 F (37.3 C)   TempSrc:   Oral   SpO2: 96% 99% 94% 96%  Weight:      Height:      PainSc:        Isolation Precautions No active isolations  Medications Medications  aspirin  chewable tablet 81 mg (has no administration in time range)  atorvastatin  (LIPITOR) tablet 20 mg  (has no administration in time range)  losartan  (COZAAR ) tablet 25 mg (has no administration in time range)  finasteride  (PROSCAR ) tablet 5 mg (has no administration in time range)  tamsulosin  (FLOMAX ) capsule 0.4 mg (has no administration in time range)  enoxaparin  (LOVENOX ) injection 40 mg (has no administration in time range)  acetaminophen  (TYLENOL ) tablet 650 mg (has no administration in time range)    Or  acetaminophen  (TYLENOL ) suppository 650 mg (has no administration in time range)  ondansetron  (ZOFRAN ) tablet 4 mg (has no administration in time range)    Or  ondansetron  (ZOFRAN ) injection 4 mg (has no administration in time range)  oxyCODONE  (Oxy IR/ROXICODONE ) immediate release tablet 5 mg (has no administration in time range)  insulin  glargine-yfgn (SEMGLEE ) injection 25 Units (has no administration in time range)  insulin  aspart (novoLOG ) injection 0-24 Units (has no administration in time range)  cefTRIAXone  (ROCEPHIN ) 2 g in sodium chloride  0.9 % 100 mL IVPB (has no administration in time range)  lactated ringers  infusion (has no administration in time range)  cefTRIAXone  (ROCEPHIN ) 1 g in sodium chloride  0.9 % 100 mL IVPB (0 g Intravenous Stopped 02/27/24 1505)  iohexol  (OMNIPAQUE ) 300 MG/ML solution 100 mL (100 mLs Intravenous Contrast Given 02/27/24 1554)  lactated ringers  bolus 500 mL (0 mLs Intravenous Stopped 02/27/24 1821)  losartan  (COZAAR ) tablet 25 mg (25 mg Oral Given 02/27/24 1826)  insulin  glargine-yfgn (SEMGLEE ) injection 10 Units (10 Units Subcutaneous Given 02/27/24 1835)  lactated ringers  bolus 1,000 mL (1,000 mLs Intravenous New Bag/Given 02/27/24 1855)    Mobility manual wheelchair

## 2024-02-27 NOTE — ED Triage Notes (Addendum)
 Pt BIB [facility] from home. They were called out for AMS. Pt has had 3-4 days of difficulty of concentrating, fatigue, urinary frequency and dysuria. Pt was started AZO and has not had any improvement. Pt with hx of prostate CA and has radioactive seeds in place. EMS report pt is warm to touch. A/O x4 for EMS.   EMS Vitals    154/78 HR 104 RR 16 SpO2 96% on R/A CBG 219 ETCO2 38

## 2024-02-27 NOTE — ED Notes (Signed)
 LAB called to add urine culture.

## 2024-02-27 NOTE — ED Notes (Signed)
 This nurse called floor to notify of upcoming patient.

## 2024-02-27 NOTE — ED Provider Notes (Signed)
 [place] EMERGENCY DEPARTMENT AT [name] Provider Note   CSN: [id number] Arrival date & time: 02/27/24  1218     History  Chief Complaint  Patient presents with   Dysuria         Alex Sanchez is a 78 y.o. male.  HPI Patient reports he thinks has been sick for a little over a week.  He has been getting up many times during the night to urinate.  He is experiencing some discomfort with urination.  A couple of days ago he called his doctor's office and was started on Azo.  He reports has had no improvement.  Family members have noted that he has been much more fatigued and sleeping a lot over the past week.  Family embers notes some vague level of confusion or disorientation.  Patient denies that he feels confused.  He reports he is answering all questions appropriately.  He denies he has back pain or abdominal pain.  He does report he thinks he has been constipated but has had episodes of stool incontinence while straining to try to urinate.  No falls.  No fever that he is aware of.  He reports he thinks he vomited once last night but has not had frequent vomiting.  Family members have noted decreased appetite.  She has history of Stage 2a adenocarcinoma prostate cancer.  He had radioactive seed implant done [phone].  He denies frequent urinary tract infection but thinks he had one about 3 months ago.    Home Medications Prior to Admission medications   Medication Sig Start Date End Date Taking? Authorizing Provider  ACCU-CHEK AVIVA PLUS test strip 1 each 2 (two) times daily. 10/12/21   [provider]  Accu-Chek Softclix Lancets lancets USE TO CHECK BLOOD SUGAR TWICE DAILY 10/05/21   [provider]  aspirin  81 MG chewable tablet Chew 81 mg by mouth daily.    [provider]  atorvastatin  (LIPITOR) 20 MG tablet Take 20 mg by mouth every evening.    [provider]  B-D ULTRAFINE III SHORT PEN 31G X 8 MM MISC Inject 1 each into the  skin 3 (three) times daily. 12/31/21   [provider]  cholecalciferol (VITAMIN D3) 25 MCG (1000 UNIT) tablet Take 1,000 Units by mouth daily.    [provider]  dorzolamide-timolol (COSOPT) 2-0.5 % ophthalmic solution Place 1 drop into both eyes 2 (two) times daily. 11/22/22   [provider]  finasteride  (PROSCAR ) 5 MG tablet Take 1 tablet by mouth daily.    [provider]  fluticasone (FLONASE) 50 MCG/ACT nasal spray Place 2 sprays into both nostrils daily. 11/12/21   [provider]  glipiZIDE (GLUCOTROL XL) 5 MG 24 hr tablet Take 10 mg by mouth 2 (two) times daily.  Takes 2 of 10 mg bid    [provider]  insulin  glargine (LANTUS ) 100 UNIT/ML Solostar Pen Inject 10 units of insulin  in the morning and 5 units before bedtime Patient taking differently: Inject 28 units of insulin  in the morning and 9 units before bedtime 02/05/23   Aida Alexander A, DO  latanoprost (XALATAN) 0.005 % ophthalmic solution 1 drop at bedtime. 12/14/21   [provider]  losartan  (COZAAR ) 25 MG tablet Take 25 mg by mouth daily. 12/10/21   [provider]  metFORMIN (GLUCOPHAGE) 1000 MG tablet Take 1,000 mg by mouth 2 (two) times daily. 12/05/21   [provider]  montelukast (SINGULAIR) 10 MG tablet Take  10 mg by mouth daily. 12/31/21   [provider]  Multiple Vitamins-Minerals (MULTIVITAMIN WITH MINERALS) tablet Take 1 tablet by mouth daily.    [provider]  pioglitazone (ACTOS) 30 MG tablet Take 30 mg by mouth every evening.    [provider]  Safety Seal Miscellaneous MISC Please fill for Lantus  Solostar pen needles 1 box 02/05/23   Aida Alexander A, DO  tamsulosin  (FLOMAX ) 0.4 MG CAPS capsule Take 0.4 mg by mouth 2 (two) times daily. Pt takes 2 tablets daily. 12/11/21   [provider]  triamcinolone cream (KENALOG) 0.1 % Apply to affected area twice a day as needed for rash Patient not taking:  Reported on 08/05/2023    [provider]      Allergies    Codeine    Review of Systems   Review of Systems  Physical Exam Updated Vital Signs BP (!) 166/70   Pulse 94   Temp 99.1 F (37.3 C) (Oral)   Resp 18   Ht 6' (1.829 m)   Wt 87.5 kg   SpO2 96%   BMI 26.18 kg/m  Physical Exam Constitutional:      Comments: Alert nontoxic.  Clear mental status.  No respiratory distress.  HENT:     Mouth/Throat:     Pharynx: Oropharynx is clear.  Eyes:     Extraocular Movements: Extraocular movements intact.  Cardiovascular:     Rate and Rhythm: Normal rate and regular rhythm.  Pulmonary:     Effort: Pulmonary effort is normal.     Breath sounds: Normal breath sounds.  Abdominal:     Comments: Abdomen is nontender.  Patient does have some distended lower abdomen.  There seems to be firmness to the right but I cannot clearly delineate distended bladder.  Groin and inguinal canals are nontender without fullness.  No CVA tenderness to percussion.  Musculoskeletal:        General: Normal range of motion.     Comments: No significant peripheral edema.  Mild edema at the ankles.  Calves are soft and nontender symmetric.  Skin:    General: Skin is warm and dry.  Neurological:     General: No focal deficit present.     Mental Status: He is oriented to person, place, and time.     Comments: And examined in the stretcher.  Patient was not ambulated.  He is alert and appropriately interactive.  Speech is clear.  He follows commands appropriately.  No focal motor deficits noted.  Psychiatric:        Mood and Affect: Mood normal.     ED Results / Procedures / Treatments   Labs (all labs ordered are listed, but only abnormal results are displayed) Labs Reviewed  URINALYSIS, ROUTINE W REFLEX MICROSCOPIC - Abnormal; Notable for the following components:      Result Value   Color, Urine AMBER (*)    APPearance TURBID (*)    Glucose, UA 150 (*)    Hgb urine dipstick SMALL (*)     Ketones, ur 5 (*)    Protein, ur >=300 (*)    Nitrite POSITIVE (*)    Leukocytes,Ua LARGE (*)    Non Squamous Epithelial 0-5 (*)    All other components within normal limits  CBC - Abnormal; Notable for the following components:   WBC 3.3 (*)    RBC 4.13 (*)    Hemoglobin 11.2 (*)    HCT 36.6 (*)    All other components  within normal limits  BASIC METABOLIC PANEL WITH GFR - Abnormal; Notable for the following components:   Sodium 134 (*)    Glucose, Bld 247 (*)    Calcium  8.6 (*)    All other components within normal limits  I-STAT CG4 LACTIC ACID, ED - Abnormal; Notable for the following components:   Lactic Acid, Venous 2.2 (*)    All other components within normal limits  CBG MONITORING, ED - Abnormal; Notable for the following components:   Glucose-Capillary 198 (*)    All other components within normal limits  URINE CULTURE  CULTURE, BLOOD (ROUTINE X 2)  CULTURE, BLOOD (ROUTINE X 2)  HEPATIC FUNCTION PANEL  LIPASE, BLOOD  I-STAT CG4 LACTIC ACID, ED    EKG None  Radiology No results found.  Procedures Procedures    Medications Ordered in ED Medications  cefTRIAXone  (ROCEPHIN ) 1 g in sodium chloride  0.9 % 100 mL IVPB (0 g Intravenous Stopped 02/27/24 1505)  iohexol  (OMNIPAQUE ) 300 MG/ML solution 100 mL (100 mLs Intravenous Contrast Given 02/27/24 1554)  lactated ringers  bolus 500 mL (0 mLs Intravenous Stopped 02/27/24 1821)  losartan  (COZAAR ) tablet 25 mg (25 mg Oral Given 02/27/24 1826)  insulin  glargine-yfgn (SEMGLEE ) injection 10 Units (10 Units Subcutaneous Given 02/27/24 1835)    ED Course/ Medical Decision Making/ A&P                                 Medical Decision Making Amount and/or Complexity of Data Reviewed Labs: ordered. Radiology: ordered.  Risk Prescription drug management. Decision regarding hospitalization.   Presents from home.  He has history of prostate cancer with radioactive seed implantation 1124.  Patient has had dysuria and  frequency for over a week.  He has not had antibiotic therapy or urinary testing during that period of time.  Family members noted some amount of fatigue and possible disorientation.  At this time patient does not appear confused and does not have overtly septic appearance.  Will proceed with broad diagnostic evaluation including blood cultures as urine has returned grossly positive with greater than 50 WBC and nitrite positive.  Patient's age and prostate cancer increased risk for bacteremia or sepsis.  Initiate IV dose of Rocephin  at this time.  He does not immediately appear to need volume resuscitation.  Blood pressures are stable heart rate is under 100 and he is afebrile.  Will follow-up with labs and also obtain CT scanning for report of incontinence versus constipation and urinary symptoms to rule out any obstruction or rectal involvement after prostate seeding.  Personally reviewed CT scan.  There is bladder wall thickening but I do not appreciate other obstruction or obvious vascular anomaly.  Will be awaiting radiology review.  Count 3.3 H&H 11 and 36.  Urine grossly positive with greater than 50 WBCs and nitrite positive.  Lactic went from 1.1-2.2.  Radiology review of CT scan consistent with my evaluation and with bladder wall thickening but no other acute abnormalities noted.  This time with significant UTI, general weakness and variable confusion, will plan for admission for hydration and IV antibiotics.  Consult:[facility] hospitalist for admission.        Final Clinical Impression(s) / ED Diagnoses Final diagnoses:  Acute cystitis without hematuria  Confusion  General weakness    Rx / DC Orders ED Discharge Orders     None         Wynetta Heckle, MD 02/27/24 1918

## 2024-02-28 DIAGNOSIS — N3090 Cystitis, unspecified without hematuria: Secondary | ICD-10-CM | POA: Diagnosis not present

## 2024-02-28 LAB — BASIC METABOLIC PANEL WITH GFR
Anion gap: 10 (ref 5–15)
BUN: 11 mg/dL (ref 8–23)
CO2: 24 mmol/L (ref 22–32)
Calcium: 8.7 mg/dL — ABNORMAL LOW (ref 8.9–10.3)
Chloride: 98 mmol/L (ref 98–111)
Creatinine, Ser: 0.93 mg/dL (ref 0.61–1.24)
GFR, Estimated: >60 mL/min (ref >60)
Glucose, Bld: 196 mg/dL — ABNORMAL HIGH (ref 70–99)
Potassium: 3.6 mmol/L (ref 3.5–5.1)
Sodium: 132 mmol/L — ABNORMAL LOW (ref 135–145)

## 2024-02-28 LAB — GLUCOSE, CAPILLARY
Glucose-Capillary: 136 mg/dL — ABNORMAL HIGH (ref 70–99)
Glucose-Capillary: 254 mg/dL — ABNORMAL HIGH (ref 70–99)
Glucose-Capillary: 240 mg/dL — ABNORMAL HIGH (ref 70–99)
Glucose-Capillary: 97 mg/dL (ref 70–99)

## 2024-02-28 LAB — CBC
HCT: 37.6 % — ABNORMAL LOW (ref 39.0–52.0)
Hemoglobin: 11.6 g/dL — ABNORMAL LOW (ref 13.0–17.0)
MCH: 26.9 pg (ref 26.0–34.0)
MCHC: 30.9 g/dL (ref 30.0–36.0)
MCV: 87.2 fL (ref 80.0–100.0)
Platelets: 292 10*3/uL (ref 150–400)
RBC: 4.31 MIL/uL (ref 4.22–5.81)
RDW: 13.9 % (ref 11.5–15.5)
WBC: 5.2 10*3/uL (ref 4.0–10.5)
nRBC: 0 % (ref 0.0–0.2)

## 2024-02-28 MED ORDER — INSULIN GLARGINE-YFGN 100 UNIT/ML ~~LOC~~ SOLN
35.0000 [IU] | Freq: Every day | SUBCUTANEOUS | Status: DC
  Administered 2024-02-28 – 2024-03-01 (×3): 35 [IU] via SUBCUTANEOUS
  Filled 2024-02-28 (×5): qty 0.35

## 2024-02-28 NOTE — Progress Notes (Signed)
 PROGRESS NOTE    Alex Sanchez  VFI:[id number]  DOB: [birth year]  DOA: 02/27/2024 PCP: Jace Martinet, FNP Outpatient Specialists:   Hospital course:  78 year old man with HTN, DM2, prostate CA was admitted with 1 week of weakness, dysuria, urgency and frequency.  Laboratory data were notable for WBC 3.3 and markedly positive UA.  CT scan notable for cystitis.  Patient was started on ceftriaxone    Subjective:  Patient seen surrounded by his family.  Patient states he feels much better than he has before.  Denies any confusion.  Notes he is still tired but much less weak than he had been.  Urgency and frequency are also improving.   Objective: Vitals:   02/27/24 2323 02/28/24 0351 02/28/24 0600 02/28/24 1213  BP: (!) 137/56 (!) 174/72  130/60  Pulse: (!) 105 (!) 109  97  Resp:  20  20  Temp: 99.9 F (37.7 C) (!) 102.5 F (39.2 C) 98.7 F (37.1 C) 98.1 F (36.7 C)  TempSrc: Oral Oral Oral Oral  SpO2: 93% 96%  97%  Weight:      Height:        Intake/Output Summary (Last 24 hours) at 02/28/2024 1706 Last data filed at 02/28/2024 1400 Gross per 24 hour  Intake 1923.86 ml  Output 1050 ml  Net 873.86 ml   Filed Weights   02/27/24 1254  Weight: 87.5 kg     Exam:  General: Tired appearing man surrounded by attentive family Eyes: sclera anicteric, conjuctiva mild injection bilaterally CVS: S1-S2, regular  Respiratory:  decreased air entry bilaterally secondary to decreased inspiratory effort, rales at bases  GI: NABS, soft, NT  LE: Warm and well-perfused Neuro: A/O x 3,  grossly nonfocal.  Psych: patient is logical and coherent, judgement and insight appear normal, mood and affect appropriate to situation.  Data Reviewed:  Basic Metabolic Panel: Recent Labs  Lab 02/27/24 1434 02/28/24 0503  NA 134* 132*  K 3.7 3.6  CL 100 98  CO2 25 24  GLUCOSE 247* 196*  BUN 13 11  CREATININE 1.00 0.93  CALCIUM  8.6* 8.7*    CBC: Recent Labs  Lab 02/27/24 1434  02/28/24 0503  WBC 3.3* 5.2  HGB 11.2* 11.6*  HCT 36.6* 37.6*  MCV 88.6 87.2  PLT 323 292     Scheduled Meds:  aspirin   81 mg Oral Daily   atorvastatin   20 mg Oral QPM   enoxaparin  (LOVENOX ) injection  40 mg Subcutaneous QHS   finasteride   5 mg Oral Daily   insulin  aspart  0-24 Units Subcutaneous TID WC   insulin  glargine-yfgn  25 Units Subcutaneous QHS   losartan   25 mg Oral Daily   tamsulosin   0.4 mg Oral BID   Continuous Infusions:  cefTRIAXone  (ROCEPHIN )  IV 2 g (02/28/24 1456)   lactated ringers  125 mL/hr at 02/28/24 1222     Assessment & Plan:   Sepsis secondary to UTI Sepsis physiology resolved Patient responding clinically well to ceftriaxone  day #2  DM 2 Blood sugars remain elevated Will increase patient's glargine back to 35 units which is his home dose Continue high dose SSI coverage  BPH Continue Flomax  and finasteride   HTN Now well-controlled on losartan    DVT prophylaxis: Lovenox  Code Status: Full Family Communication: Entire family was at bedside     Studies: CT ABDOMEN PELVIS W CONTRAST Result Date: 02/27/2024 CLINICAL DATA:  Fatigue urinary frequency EXAM: CT ABDOMEN AND PELVIS WITH CONTRAST TECHNIQUE: Multidetector CT imaging of the abdomen and  pelvis was performed using the standard protocol following bolus administration of intravenous contrast. RADIATION DOSE REDUCTION: This exam was performed according to the departmental dose-optimization program which includes automated exposure control, adjustment of the mA and/or kV according to patient size and/or use of iterative reconstruction technique. CONTRAST:  OMNIPAQUE  IOHEXOL  300 MG/ML  SOLN COMPARISON:  CT 09/29/2013 FINDINGS: Lower chest: Lung bases demonstrate no acute airspace disease. Hepatobiliary: Hepatic steatosis. No calcified gallstone or biliary dilatation Pancreas: Unremarkable. No pancreatic ductal dilatation or surrounding inflammatory changes. Spleen: Normal in size without  focal abnormality. Adrenals/Urinary Tract: Adrenal glands are normal. Kidneys show no hydronephrosis. Thick-walled urinary bladder with mucosal enhancement and mild perivesical stranding Stomach/Bowel: Stomach nonenlarged. No dilated small bowel. Negative appendix. No acute bowel wall thickening Vascular/Lymphatic: Aortic atherosclerosis. No enlarged abdominal or pelvic lymph nodes. Reproductive: Multiple prostate seeds Other: Negative for pelvic effusion or free air Musculoskeletal: No acute or suspicious osseous abnormality. Multilevel degenerative changes IMPRESSION: 1. Thick-walled urinary bladder with mucosal enhancement and mild perivesical stranding, findings suggestive of cystitis. 2. Hepatic steatosis. 3. Aortic atherosclerosis. Aortic Atherosclerosis (ICD10-I70.0). Electronically Signed   By: Esmeralda Hedge M.D.   On: 02/27/2024 18:37    Principal Problem:   Cystitis     Jovita Nipper Rollene Clink, [facility]  If 7PM-7AM, please contact night-coverage [email or web address]   LOS: 1 day

## 2024-02-28 NOTE — Plan of Care (Signed)
  Problem: Clinical Measurements: Goal: Ability to maintain clinical measurements within normal limits will improve Outcome: Progressing Goal: Will remain free from infection Outcome: Progressing Goal: Diagnostic test results will improve Outcome: Progressing Goal: Respiratory complications will improve Outcome: Progressing Goal: Cardiovascular complication will be avoided Outcome: Progressing   Problem: Education: Goal: Knowledge of General Education information will improve Description: Including pain rating scale, medication(s)/side effects and non-pharmacologic comfort measures Outcome: Progressing   Problem: Elimination: Goal: Will not experience complications related to bowel motility Outcome: Progressing Goal: Will not experience complications related to urinary retention Outcome: Progressing   Problem: Nutrition: Goal: Adequate nutrition will be maintained Outcome: Progressing

## 2024-02-28 NOTE — Progress Notes (Signed)
 Mobility Specialist - Progress Note   02/28/24 1320  Mobility  Activity Ambulated with assistance to bathroom;Ambulated with assistance in hallway  Level of Assistance Contact guard assist, steadying assist  Assistive Device Front wheel walker  Distance Ambulated (ft) 300 ft  Range of Motion/Exercises Active Assistive  Activity Response Tolerated fair  Mobility Referral Yes  Mobility visit 1 Mobility  Mobility Specialist Start Time (ACUTE ONLY) 1305  Mobility Specialist Stop Time (ACUTE ONLY) 1320  Mobility Specialist Time Calculation (min) (ACUTE ONLY) 15 min   Pt was found in bed and agreeable to ambulate. Unsteady going STS and ambulating to bathroom with no AD. Recommended to pt the use of RW due to unsteadiness. Afterwards returned to recliner chair with all needs met. Call bell in reach and family in room. RN notified.  Lorna Rose Mobility Specialist

## 2024-02-29 DIAGNOSIS — N3090 Cystitis, unspecified without hematuria: Secondary | ICD-10-CM | POA: Diagnosis not present

## 2024-02-29 LAB — URINE CULTURE: Culture: >=100000 — AB

## 2024-02-29 LAB — GLUCOSE, CAPILLARY
Glucose-Capillary: 81 mg/dL (ref 70–99)
Glucose-Capillary: 166 mg/dL — ABNORMAL HIGH (ref 70–99)
Glucose-Capillary: 247 mg/dL — ABNORMAL HIGH (ref 70–99)
Glucose-Capillary: 193 mg/dL — ABNORMAL HIGH (ref 70–99)

## 2024-02-29 NOTE — Evaluation (Signed)
 Physical Therapy Evaluation Patient Details Name: Alex Sanchez MRN: [id number] DOB: 13-Jan-1946 Today's Date: 02/29/2024  History of Present Illness  Pt is 78 yo male admitted on 02/27/24 with sepsis secondary to UTI.  Pt with hx including but not limited to HTN, DM2, prostate CA  Clinical Impression  Pt admitted with above diagnosis. AT baseline, pt independent, ambulatory in community without AD, and lives on second floor apt w a flight of steps to enter.  Today, pt ambulating 300' with CGA/supervision level.  He did demonstrate some mild instabilities without AD which is not his baseline.  Pt expected to progress well - will follow in acute care but expect no PT needs at d/c.  Pt currently with functional limitations due to the deficits listed below (see PT Problem List). Pt will benefit from acute skilled PT to increase their independence and safety with mobility to allow discharge.           If plan is discharge home, recommend the following: A little help with walking and/or transfers;A little help with bathing/dressing/bathroom;Assistance with cooking/housework;Help with stairs or ramp for entrance   Can travel by private vehicle        Equipment Recommendations Rolling walker (2 wheels)  Recommendations for Other Services       Functional Status Assessment Patient has had a recent decline in their functional status and demonstrates the ability to make significant improvements in function in a reasonable and predictable amount of time.     Precautions / Restrictions Precautions Precautions: Fall      Mobility  Bed Mobility               General bed mobility comments: in chair at arrival    Transfers Overall transfer level: Needs assistance Equipment used: Rolling walker (2 wheels) Transfers: Sit to/from Stand Sit to Stand: Supervision                Ambulation/Gait Ambulation/Gait assistance: Contact guard assist, Supervision Gait Distance (Feet): 300  Feet Assistive device: Rolling walker (2 wheels), None   Gait velocity: functional     General Gait Details: Started with RW and pt wtih steady gait progressing to supervision level.  Tried last 100' without RW and pt with some drifting R/L but no LOB - did require CGA and reports felt a little unsteady  Stairs            Wheelchair Mobility     Tilt Bed    Modified Rankin (Stroke Patients Only)       Balance Overall balance assessment: Needs assistance Sitting-balance support: No upper extremity supported Sitting balance-Leahy Scale: Good     Standing balance support: No upper extremity supported Standing balance-Leahy Scale: Fair                               Pertinent Vitals/Pain Pain Assessment Pain Assessment: No/denies pain    Home Living Family/patient expects to be discharged to:: Private residence Living Arrangements: Spouse/significant other Available Help at Discharge: Family;Available 24 hours/day Type of Home: Apartment Home Access: Stairs to enter   [employer] of Steps: Currently on 2nd floor with 1 flight to enter but supposed to move to first floor this week   Home Layout: One level Home Equipment: Cane - single point      Prior Function Prior Level of Function : Independent/Modified Independent;Driving  Mobility Comments: could ambulate in community wihtout AD       Extremity/Trunk Assessment   Upper Extremity Assessment Upper Extremity Assessment: Overall WFL for tasks assessed    Lower Extremity Assessment Lower Extremity Assessment: Overall WFL for tasks assessed    Cervical / Trunk Assessment Cervical / Trunk Assessment: Normal  Communication        Cognition Arousal: Alert Behavior During Therapy: WFL for tasks assessed/performed   PT - Cognitive impairments: No apparent impairments                                 Cueing       General Comments       Exercises     Assessment/Plan    PT Assessment Patient needs continued PT services  PT Problem List Decreased strength;Decreased knowledge of use of DME;Decreased balance;Decreased mobility       PT Treatment Interventions Therapeutic exercise;DME instruction;Gait training;Balance training;Stair training;Functional mobility training;Therapeutic activities;Patient/family education;Modalities;Neuromuscular re-education    PT Goals (Current goals can be found in the Care Plan section)  Acute Rehab PT Goals Patient Stated Goal: return home PT Goal Formulation: With patient/family Time For Goal Achievement: 03/14/24 Potential to Achieve Goals: Good    Frequency Min 1X/week     Co-evaluation               AM-PAC PT "6 Clicks" Mobility  Outcome Measure Help needed turning from your back to your side while in a flat bed without using bedrails?: None Help needed moving from lying on your back to sitting on the side of a flat bed without using bedrails?: None Help needed moving to and from a bed to a chair (including a wheelchair)?: A Little Help needed standing up from a chair using your arms (e.g., wheelchair or bedside chair)?: A Little Help needed to walk in hospital room?: A Little Help needed climbing 3-5 steps with a railing? : A Little 6 Click Score: 20    End of Session Equipment Utilized During Treatment: Gait belt Activity Tolerance: Patient tolerated treatment well Patient left: with chair alarm set;in chair;with call bell/phone within reach;with family/visitor present Nurse Communication: Mobility status PT Visit Diagnosis: Other abnormalities of gait and mobility (R26.89)    Time: 1053-1110 PT Time Calculation (min) (ACUTE ONLY): 17 min   Charges:   PT Evaluation $PT Eval Low Complexity: 1 Low   PT General Charges $$ ACUTE PT VISIT: 1 Visit         Cyd Dowse, PT Acute Rehab Services [facility] [phone]   Carolynn Citrin 02/29/2024, 11:33  AM

## 2024-02-29 NOTE — Progress Notes (Signed)
 PROGRESS NOTE    Alex Sanchez  ZOX:[id number] DOB: 1946/01/23 DOA: 02/27/2024 PCP: Jace Martinet, FNP   Brief Narrative:  This 78 years old male with PMH significant for HTN, DM2, prostate CA, who was admitted with 1 week of history of weakness, dysuria, urgency and frequency. Laboratory data were notable for WBC 3.3 and markedly positive UA. CT scan notable for cystitis. Patient was started on ceftriaxone  and admitted for further evaluation.  Assessment & Plan:   Principal Problem:   Cystitis  Sepsis secondary to UTI: Patient presented with dysuria,  generalized weakness,  urgency,  frequency , UA+, leukopenia. Patient initiated on IV fluids and IV empiric antibiotics. Sepsis physiology resolved. Patient responding clinically well to ceftriaxone  day # 3. Blood cultures no growth, urine cultures growing Proteus sensitivity pending.   Diabetes Mellitus 2: Blood sugars remain elevated. Continue Semglee  35 units daily Continue high dose SSI coverage   BPH: Continue Flomax  and finasteride    HTN: Now well-controlled on losartan      DVT prophylaxis: (Lovenox ) Code Status: Full code Family Communication: Wife at bedside Disposition Plan:    Status is: Inpatient Remains inpatient appropriate because:  Altered mental status likely secondary to UTI requiring antibiotics.  Cultures are pending    Consultants:  None  Procedures: None  Antimicrobials:  Anti-infectives (From admission, onward)    Start     Dose/Rate Route Frequency Ordered Stop   02/28/24 1430  cefTRIAXone  (ROCEPHIN ) 2 g in sodium chloride  0.9 % 100 mL IVPB        2 g 200 mL/hr over 30 Minutes Intravenous Every 24 hours 02/27/24 1958     02/27/24 1400  cefTRIAXone  (ROCEPHIN ) 1 g in sodium chloride  0.9 % 100 mL IVPB        1 g 200 mL/hr over 30 Minutes Intravenous  Once 02/27/24 1354 02/27/24 1505      Subjective: Patient was seen and examined at bedside. Overnight events noted.   Patient is back  to his baseline mental status,wife is at bedside.   Patient reports feeling better, He still reports having some burning and frequency but improving.  Objective: Vitals:   02/28/24 2000 02/28/24 2129 02/29/24 0452 02/29/24 0800  BP: 126/60 (!) 138/58 (!) 162/70 131/65  Pulse: 92 91 93 86  Resp: 20  20   Temp: 99 F (37.2 C)  99.8 F (37.7 C) 98.4 F (36.9 C)  TempSrc: Oral  Oral Oral  SpO2: 95% 96% 91% 94%  Weight:      Height:        Intake/Output Summary (Last 24 hours) at 02/29/2024 1023 Last data filed at 02/29/2024 0400 Gross per 24 hour  Intake 2325.31 ml  Output 800 ml  Net 1525.31 ml   Filed Weights   02/27/24 1254  Weight: 87.5 kg    Examination:  General exam: Appears calm and comfortable, not in any acute distress.  Deconditioned Respiratory system: Clear to auscultation. Respiratory effort normal.  RR 15 Cardiovascular system: S1 & S2 heard, RRR. No JVD, murmurs, rubs, gallops or clicks. No pedal edema. Gastrointestinal system: Abdomen is non distended, soft and non tender.  Normal bowel sounds heard. Central nervous system: Alert and oriented X 3. No focal neurological deficits. Extremities: No edema, no cyanosis, no clubbing Skin: No rashes, lesions or ulcers Psychiatry: Judgement and insight appear normal. Mood & affect appropriate.     Data Reviewed: I have personally reviewed following labs and imaging studies  CBC: Recent Labs  Lab 02/27/24 1434 02/28/24 0503  WBC 3.3* 5.2  HGB 11.2* 11.6*  HCT 36.6* 37.6*  MCV 88.6 87.2  PLT 323 292   Basic Metabolic Panel: Recent Labs  Lab 02/27/24 1434 02/28/24 0503  NA 134* 132*  K 3.7 3.6  CL 100 98  CO2 25 24  GLUCOSE 247* 196*  BUN 13 11  CREATININE 1.00 0.93  CALCIUM  8.6* 8.7*   GFR: Estimated Creatinine Clearance: 74.2 mL/min (by C-G formula based on SCr of 0.93 mg/dL). Liver Function Tests: Recent Labs  Lab 02/27/24 2250  AST 26  ALT 16  ALKPHOS 66  BILITOT 0.8  PROT 6.8   ALBUMIN 3.0*   Recent Labs  Lab 02/27/24 2250  LIPASE 361*   No results for input(s): "AMMONIA" in the last 168 hours. Coagulation Profile: No results for input(s): "INR", "PROTIME" in the last 168 hours. Cardiac Enzymes: No results for input(s): "CKTOTAL", "CKMB", "CKMBINDEX", "TROPONINI" in the last 168 hours. BNP (last 3 results) No results for input(s): "PROBNP" in the last 8760 hours. HbA1C: No results for input(s): "HGBA1C" in the last 72 hours. CBG: Recent Labs  Lab 02/28/24 0730 02/28/24 1129 02/28/24 1608 02/28/24 2027 02/29/24 0732  GLUCAP 136* 254* 240* 97 81   Lipid Profile: No results for input(s): "CHOL", "HDL", "LDLCALC", "TRIG", "CHOLHDL", "LDLDIRECT" in the last 72 hours. Thyroid  Function Tests: No results for input(s): "TSH", "T4TOTAL", "FREET4", "T3FREE", "THYROIDAB" in the last 72 hours. Anemia Panel: No results for input(s): "VITAMINB12", "FOLATE", "FERRITIN", "TIBC", "IRON", "RETICCTPCT" in the last 72 hours. Sepsis Labs: Recent Labs  Lab 02/27/24 1509 02/27/24 1657  LATICACIDVEN 1.1 2.2*    Recent Results (from the past 240 hours)  Urine Culture     Status: Abnormal   Collection Time: 02/27/24  1:31 PM   Specimen: Urine, Clean Catch  Result Value Ref Range Status   Specimen Description   Final    URINE, CLEAN CATCH Performed at [facility], [address] [place], [place] [address]    Special Requests   Final    NONE Performed at [facility], [address], [place], [place] [address]    Culture >=100,000 COLONIES/mL PROTEUS MIRABILIS (A)  Final   Report Status 02/29/2024 FINAL  Final   Organism ID, Bacteria PROTEUS MIRABILIS (A)  Final      Susceptibility   Proteus mirabilis - MIC*    AMPICILLIN <=2 SENSITIVE Sensitive     CEFAZOLIN  <=4 SENSITIVE Sensitive     CEFEPIME <=0.12 SENSITIVE Sensitive     CEFTRIAXONE  <=0.25 SENSITIVE Sensitive     CIPROFLOXACIN  <=0.25 SENSITIVE Sensitive      GENTAMICIN <=1 SENSITIVE Sensitive     IMIPENEM 2 SENSITIVE Sensitive     NITROFURANTOIN 128 RESISTANT Resistant     TRIMETH/SULFA <=20 SENSITIVE Sensitive     AMPICILLIN/SULBACTAM <=2 SENSITIVE Sensitive     PIP/TAZO <=4 SENSITIVE Sensitive ug/mL    * >=100,000 COLONIES/mL PROTEUS MIRABILIS  Culture, blood (routine x 2)     Status: None (Preliminary result)   Collection Time: 02/27/24  2:34 PM   Specimen: BLOOD  Result Value Ref Range Status   Specimen Description   Final    BLOOD RIGHT ANTECUBITAL Performed at [facility], [address], [place], [place] [address]    Special Requests   Final    BOTTLES DRAWN AEROBIC ONLY Blood Culture results may not be optimal due to an inadequate volume of blood received in culture bottles Performed at [facility], [address] [place], [place] [address]    Culture   Final    NO GROWTH 2 DAYS Performed at [facility], [address], [place], [place] [address]    Report Status PENDING  Incomplete  Culture, blood (routine x 2)     Status: None (Preliminary result)   Collection Time: 02/27/24  3:24 PM   Specimen: BLOOD  Result Value Ref Range Status   Specimen Description   Final    BLOOD SITE NOT SPECIFIED Performed at [facility], [address] [place], [place] [address]    Special Requests   Final    BOTTLES DRAWN AEROBIC AND ANAEROBIC Blood Culture results may not be optimal due to an inadequate volume of blood received in culture bottles Performed at [facility], [address] [place], [place] [address]    Culture   Final    NO GROWTH 2 DAYS Performed at [name], [address], [place], [place] [address]    Report Status PENDING  Incomplete    Radiology Studies: CT ABDOMEN PELVIS W CONTRAST Result Date: 02/27/2024 CLINICAL DATA:  Fatigue urinary frequency EXAM: CT ABDOMEN AND PELVIS WITH CONTRAST TECHNIQUE: Multidetector CT  imaging of the abdomen and pelvis was performed using the standard protocol following bolus administration of intravenous contrast. RADIATION DOSE REDUCTION: This exam was performed according to the departmental dose-optimization program which includes automated exposure control, adjustment of the mA and/or kV according to patient size and/or use of iterative reconstruction technique. CONTRAST:  OMNIPAQUE  IOHEXOL  300 MG/ML  SOLN COMPARISON:  CT 09/29/2013 FINDINGS: Lower chest: Lung bases demonstrate no acute airspace disease. Hepatobiliary: Hepatic steatosis. No calcified gallstone or biliary dilatation Pancreas: Unremarkable. No pancreatic ductal dilatation or surrounding inflammatory changes. Spleen: Normal in size without focal abnormality. Adrenals/Urinary Tract: Adrenal glands are normal. Kidneys show no hydronephrosis. Thick-walled urinary bladder with mucosal enhancement and mild perivesical stranding Stomach/Bowel: Stomach nonenlarged. No dilated small bowel. Negative appendix. No acute bowel wall thickening Vascular/Lymphatic: Aortic atherosclerosis. No enlarged abdominal or pelvic lymph nodes. Reproductive: Multiple prostate seeds Other: Negative for pelvic effusion or free air Musculoskeletal: No acute or suspicious osseous abnormality. Multilevel degenerative changes IMPRESSION: 1. Thick-walled urinary bladder with mucosal enhancement and mild perivesical stranding, findings suggestive of cystitis. 2. Hepatic steatosis. 3. Aortic atherosclerosis. Aortic Atherosclerosis (ICD10-I70.0). Electronically Signed   By: Esmeralda Hedge M.D.   On: 02/27/2024 18:37   Scheduled Meds:  aspirin   81 mg Oral Daily   atorvastatin   20 mg Oral QPM   enoxaparin  (LOVENOX ) injection  40 mg Subcutaneous QHS   finasteride   5 mg Oral Daily   insulin  aspart  0-24 Units Subcutaneous TID WC   insulin  glargine-yfgn  35 Units Subcutaneous QHS   losartan   25 mg Oral Daily   tamsulosin   0.4 mg Oral BID   Continuous  Infusions:  cefTRIAXone  (ROCEPHIN )  IV 2 g (02/28/24 1456)   lactated ringers  125 mL/hr at 02/29/24 0308     LOS: 2 days    Time spent: 50 mins    Magdalene School, MD [facility]   If 7PM-7AM, please contact night-coverage

## 2024-03-01 DIAGNOSIS — N3090 Cystitis, unspecified without hematuria: Secondary | ICD-10-CM | POA: Diagnosis not present

## 2024-03-01 LAB — GLUCOSE, CAPILLARY
Glucose-Capillary: 159 mg/dL — ABNORMAL HIGH (ref 70–99)
Glucose-Capillary: 181 mg/dL — ABNORMAL HIGH (ref 70–99)
Glucose-Capillary: 249 mg/dL — ABNORMAL HIGH (ref 70–99)
Glucose-Capillary: 253 mg/dL — ABNORMAL HIGH (ref 70–99)

## 2024-03-01 MED ORDER — CEFAZOLIN SODIUM-DEXTROSE 2-4 GM/100ML-% IV SOLN
2.0000 g | Freq: Three times a day (TID) | INTRAVENOUS | Status: DC
  Administered 2024-03-01 – 2024-03-02 (×3): 2 g via INTRAVENOUS
  Filled 2024-03-01 (×4): qty 100

## 2024-03-01 NOTE — Progress Notes (Signed)
 PROGRESS NOTE    Alex Sanchez  VHQ:[id number] DOB: July 29, 1946 DOA: 02/27/2024 PCP: Jace Martinet, FNP   Brief Narrative:  This 78 years old male with PMH significant for HTN, DM2, prostate CA, who was admitted with 1 week of history of weakness, dysuria, urgency and frequency. Laboratory data were notable for WBC 3.3 and markedly positive UA. CT scan notable for cystitis. Patient was started on ceftriaxone  and admitted for further evaluation.  Assessment & Plan:   Principal Problem:   Cystitis  Sepsis secondary to UTI: Patient presented with dysuria,  generalized weakness,  urgency,  frequency , UA+, leukopenia. Patient initiated on IV fluids and IV empiric antibiotics. Sepsis physiology resolved. Patient responding clinically well to ceftriaxone  day # 4. Blood cultures no growth, urine cultures growing Proteus sensitivity pending.  Acute metabolic encephalopathy due to UTI. Workup consistent with UTI.  Continue IV antibiotics Patient is back to baseline mental status.   Diabetes Mellitus 2: Blood sugars remain elevated. Continue Semglee  35 units daily Continue high dose SSI coverage   BPH: Continue Flomax  and finasteride    HTN: Now well-controlled on losartan .  Tachycardia with exertion: Heart rate goes up while ambulation. Continue IV hydration.  PT and OT evaluation.     DVT prophylaxis: (Lovenox ) Code Status: Full code Family Communication: Wife at bedside Disposition Plan:    Status is: Inpatient Remains inpatient appropriate because:  Altered mental status likely secondary to UTI requiring antibiotics.  Cultures are pending  Consultants:  None  Procedures: None  Antimicrobials:  Anti-infectives (From admission, onward)    Start     Dose/Rate Route Frequency Ordered Stop   02/28/24 1430  cefTRIAXone  (ROCEPHIN ) 2 g in sodium chloride  0.9 % 100 mL IVPB        2 g 200 mL/hr over 30 Minutes Intravenous Every 24 hours 02/27/24 1958     02/27/24 1400   cefTRIAXone  (ROCEPHIN ) 1 g in sodium chloride  0.9 % 100 mL IVPB        1 g 200 mL/hr over 30 Minutes Intravenous  Once 02/27/24 1354 02/27/24 1505      Subjective: Patient was seen and examined at bedside. Overnight events noted.   Patient is back to his baseline mental status, wife is at bedside.   Patient reports having palpitations when going to bathroom.  Denies any chest pain.   He still reports feeling weak and tired.  Objective: Vitals:   02/29/24 2018 02/29/24 2121 03/01/24 0407 03/01/24 0407  BP: 126/63   137/69  Pulse: 80   82  Resp: 18 17 18 18   Temp: (!) 97.4 F (36.3 C)   98.7 F (37.1 C)  TempSrc: Oral   Oral  SpO2: 95%   94%  Weight:      Height:        Intake/Output Summary (Last 24 hours) at 03/01/2024 1112 Last data filed at 03/01/2024 0900 Gross per 24 hour  Intake 4097.03 ml  Output 2500 ml  Net 1597.03 ml   Filed Weights   02/27/24 1254  Weight: 87.5 kg    Examination:  General exam: Appears comfortable, not in any acute distress.  Deconditioned Respiratory system: CTA Bilaterally . Respiratory effort normal.  RR 15 Cardiovascular system: S1 & S2 heard, RRR. No JVD, murmurs, rubs, gallops or clicks. No pedal edema. Gastrointestinal system: Abdomen is non distended, soft and non tender.  Normal bowel sounds heard. Central nervous system: Alert and oriented X 3. No focal neurological deficits. Extremities: No edema, no cyanosis, no clubbing  Skin: No rashes, lesions or ulcers Psychiatry: Judgement and insight appear normal. Mood & affect appropriate.     Data Reviewed: I have personally reviewed following labs and imaging studies  CBC: Recent Labs  Lab 02/27/24 1434 02/28/24 0503  WBC 3.3* 5.2  HGB 11.2* 11.6*  HCT 36.6* 37.6*  MCV 88.6 87.2  PLT 323 292   Basic Metabolic Panel: Recent Labs  Lab 02/27/24 1434 02/28/24 0503  NA 134* 132*  K 3.7 3.6  CL 100 98  CO2 25 24  GLUCOSE 247* 196*  BUN 13 11  CREATININE 1.00 0.93   CALCIUM  8.6* 8.7*   GFR: Estimated Creatinine Clearance: 74.2 mL/min (by C-G formula based on SCr of 0.93 mg/dL). Liver Function Tests: Recent Labs  Lab 02/27/24 2250  AST 26  ALT 16  ALKPHOS 66  BILITOT 0.8  PROT 6.8  ALBUMIN 3.0*   Recent Labs  Lab 02/27/24 2250  LIPASE 361*   No results for input(s): "AMMONIA" in the last 168 hours. Coagulation Profile: No results for input(s): "INR", "PROTIME" in the last 168 hours. Cardiac Enzymes: No results for input(s): "CKTOTAL", "CKMB", "CKMBINDEX", "TROPONINI" in the last 168 hours. BNP (last 3 results) No results for input(s): "PROBNP" in the last 8760 hours. HbA1C: No results for input(s): "HGBA1C" in the last 72 hours. CBG: Recent Labs  Lab 02/29/24 0732 02/29/24 1135 02/29/24 1628 02/29/24 2054 03/01/24 0748  GLUCAP 81 166* 247* 193* 159*   Lipid Profile: No results for input(s): "CHOL", "HDL", "LDLCALC", "TRIG", "CHOLHDL", "LDLDIRECT" in the last 72 hours. Thyroid  Function Tests: No results for input(s): "TSH", "T4TOTAL", "FREET4", "T3FREE", "THYROIDAB" in the last 72 hours. Anemia Panel: No results for input(s): "VITAMINB12", "FOLATE", "FERRITIN", "TIBC", "IRON", "RETICCTPCT" in the last 72 hours. Sepsis Labs: Recent Labs  Lab 02/27/24 1509 02/27/24 1657  LATICACIDVEN 1.1 2.2*    Recent Results (from the past 240 hours)  Urine Culture     Status: Abnormal   Collection Time: 02/27/24  1:31 PM   Specimen: Urine, Clean Catch  Result Value Ref Range Status   Specimen Description   Final    URINE, CLEAN CATCH Performed at [facility], [address] [place], [place] [address]    Special Requests   Final    NONE Performed at [facility], [address], [place], [place] [address]    Culture >=100,000 COLONIES/mL PROTEUS MIRABILIS (A)  Final   Report Status 02/29/2024 FINAL  Final   Organism ID, Bacteria PROTEUS MIRABILIS (A)  Final      Susceptibility   Proteus  mirabilis - MIC*    AMPICILLIN <=2 SENSITIVE Sensitive     CEFAZOLIN  <=4 SENSITIVE Sensitive     CEFEPIME <=0.12 SENSITIVE Sensitive     CEFTRIAXONE  <=0.25 SENSITIVE Sensitive     CIPROFLOXACIN  <=0.25 SENSITIVE Sensitive     GENTAMICIN <=1 SENSITIVE Sensitive     IMIPENEM 2 SENSITIVE Sensitive     NITROFURANTOIN 128 RESISTANT Resistant     TRIMETH/SULFA <=20 SENSITIVE Sensitive     AMPICILLIN/SULBACTAM <=2 SENSITIVE Sensitive     PIP/TAZO <=4 SENSITIVE Sensitive ug/mL    * >=100,000 COLONIES/mL PROTEUS MIRABILIS  Culture, blood (routine x 2)     Status: None (Preliminary result)   Collection Time: 02/27/24  2:34 PM   Specimen: BLOOD  Result Value Ref Range Status   Specimen Description   Final    BLOOD RIGHT ANTECUBITAL Performed at [name], [address] [place],  [place] [address]    Special Requests   Final    BOTTLES DRAWN AEROBIC ONLY Blood Culture results may not be optimal due to an inadequate volume of blood received in culture bottles Performed at [facility], [address] [place], [place] [address]    Culture   Final    NO GROWTH 3 DAYS Performed at [facility], [address], [place], [place] [address]    Report Status PENDING  Incomplete  Culture, blood (routine x 2)     Status: None (Preliminary result)   Collection Time: 02/27/24  3:24 PM   Specimen: BLOOD  Result Value Ref Range Status   Specimen Description   Final    BLOOD SITE NOT SPECIFIED Performed at [facility], [address], [place], [place] [address]    Special Requests   Final    BOTTLES DRAWN AEROBIC AND ANAEROBIC Blood Culture results may not be optimal due to an inadequate volume of blood received in culture bottles Performed at [name], [address], [place], [place] [address]    Culture   Final    NO GROWTH 3 DAYS Performed at [name], [address], [place], [place] [address]     Report Status PENDING  Incomplete    Radiology Studies: No results found.  Scheduled Meds:  aspirin   81 mg Oral Daily   atorvastatin   20 mg Oral QPM   enoxaparin  (LOVENOX ) injection  40 mg Subcutaneous QHS   finasteride   5 mg Oral Daily   insulin  aspart  0-24 Units Subcutaneous TID WC   insulin  glargine-yfgn  35 Units Subcutaneous QHS   losartan   25 mg Oral Daily   tamsulosin   0.4 mg Oral BID   Continuous Infusions:  cefTRIAXone  (ROCEPHIN )  IV 2 g (02/29/24 1357)   lactated ringers  125 mL/hr at 03/01/24 0408     LOS: 3 days    Time spent: 35 mins    Magdalene School, MD [facility]   If 7PM-7AM, please contact night-coverage

## 2024-03-02 DIAGNOSIS — N3090 Cystitis, unspecified without hematuria: Secondary | ICD-10-CM | POA: Diagnosis not present

## 2024-03-02 LAB — GLUCOSE, CAPILLARY: Glucose-Capillary: 227 mg/dL — ABNORMAL HIGH (ref 70–99)

## 2024-03-02 LAB — LIPASE, BLOOD: Lipase: 96 U/L — ABNORMAL HIGH (ref 11–51)

## 2024-03-02 LAB — LACTIC ACID, PLASMA: Lactic Acid, Venous: 1.1 mmol/L (ref 0.5–1.9)

## 2024-03-02 MED ORDER — CEFADROXIL 500 MG PO CAPS: 500.0000 mg | ORAL_CAPSULE | Freq: Two times a day (BID) | ORAL | 0 refills | Status: AC

## 2024-03-02 NOTE — Discharge Summary (Signed)
 Physician Discharge Summary  Alex Sanchez WGN:[id number] DOB: Oct 06, 1946 DOA: 02/27/2024  PCP: Jace Martinet, FNP  Admit date: 02/27/2024  Discharge date: 03/02/2024  Admitted From: Home  Disposition:  Home.  Recommendations for Outpatient Follow-up:  Follow up with PCP in 1-2 weeks. Please obtain BMP/CBC in one week Advised to take cefadroxil  500 mg twice daily for Proteus UTI for 7 days. Advised to drink more fluids.  Home Health:None Equipment/Devices:None  Discharge Condition: Stable CODE STATUS:Full code Diet recommendation: Heart Healthy   Brief [facility] Course: This 78 years old male with PMH significant for HTN, DM2, prostate CA, who was admitted with 1 week of history of weakness, dysuria, urgency and frequency. Laboratory data were notable for WBC 3.3 and markedly positive UA. CT scan notable for cystitis. Patient was started on ceftriaxone  and admitted for further evaluation. He was continued on IV antibiotics and IV hydration.  Patient has made significant improvement.  PT and OT evaluations recommended no PT needs at this time.  Patient feels better and wants to be discharged.  Urine culture grew Proteus which is pansensitive.  Sepsis physiology resolved. Patient is being discharged on cefadroxil  500 mg twice a day for 7 days. Patient being discharged home  Discharge Diagnoses:  Principal Problem:   Cystitis  Sepsis secondary to UTI: Patient presented with dysuria,  generalized weakness,  urgency,  frequency , UA+, leukopenia. Patient initiated on IV fluids and IV empiric antibiotics. Sepsis physiology resolved. Patient responding clinically well to ceftriaxone  day # 5. Blood cultures no growth, urine cultures growing Proteus sensitivity pending. Patient being discharged on cefadroxil  500 mg twice daily for 7 days.   Acute metabolic encephalopathy due to UTI: Workup consistent with UTI.  Continue IV antibiotics. Patient is back to baseline mental  status.   Diabetes Mellitus 2: Blood sugars remain elevated. Continue Semglee  35 units daily Continue high dose SSI coverage   BPH: Continue Flomax  and finasteride    HTN: Now well-controlled on losartan .   Tachycardia with exertion: Improved. Heart rate goes up while ambulation. Continue IV hydration.   Discharge Instructions  Discharge Instructions     Call MD for:  difficulty breathing, headache or visual disturbances   Complete by: As directed    Call MD for:  persistant dizziness or light-headedness   Complete by: As directed    Call MD for:  persistant nausea and vomiting   Complete by: As directed    Diet - low sodium heart healthy   Complete by: As directed    Diet Carb Modified   Complete by: As directed    Discharge instructions   Complete by: As directed    Advised to follow-up with primary care physician in 1 week. Advised to take cefadroxil  500 mg twice daily for Proteus UTI. Advised to drink more fluids. .   Increase activity slowly   Complete by: As directed       Allergies as of 03/02/2024       Reactions   Codeine Nausea And Vomiting, Other (See Comments)        Medication List     TAKE these medications    Accu-Chek Aviva Plus test strip Generic drug: glucose blood 1 each 2 (two) times daily.   Accu-Chek Softclix Lancets lancets USE TO CHECK BLOOD SUGAR TWICE DAILY   aspirin  81 MG chewable tablet Chew 81 mg by mouth daily.   atorvastatin  20 MG tablet Commonly known as: LIPITOR Take 20 mg by mouth every evening.   B-D ULTRAFINE III  SHORT PEN 31G X 8 MM Misc Generic drug: Insulin  Pen Needle Inject 1 each into the skin 3 (three) times daily.   cefadroxil  500 MG capsule Commonly known as: DURICEF Take 1 capsule (500 mg total) by mouth 2 (two) times daily for 7 days.   cholecalciferol 25 MCG (1000 UNIT) tablet Commonly known as: VITAMIN D3 Take 1,000 Units by mouth daily.   dorzolamide-timolol 2-0.5 % ophthalmic  solution Commonly known as: COSOPT Place 1 drop into both eyes 2 (two) times daily.   finasteride  5 MG tablet Commonly known as: PROSCAR  Take 1 tablet by mouth daily.   fluticasone 50 MCG/ACT nasal spray Commonly known as: FLONASE Place 2 sprays into both nostrils daily.   glipiZIDE 5 MG 24 hr tablet Commonly known as: GLUCOTROL XL Take 10 mg by mouth 2 (two) times daily.  Takes 2 of 10 mg bid   insulin  glargine 100 UNIT/ML Solostar Pen Commonly known as: LANTUS  Inject 10 units of insulin  in the morning and 5 units before bedtime What changed: additional instructions   latanoprost 0.005 % ophthalmic solution Commonly known as: XALATAN 1 drop at bedtime.   losartan  25 MG tablet Commonly known as: COZAAR  Take 25 mg by mouth daily.   metFORMIN 1000 MG tablet Commonly known as: GLUCOPHAGE Take 1,000 mg by mouth 2 (two) times daily.   montelukast 10 MG tablet Commonly known as: SINGULAIR Take 10 mg by mouth daily.   multivitamin with minerals tablet Take 1 tablet by mouth daily.   pioglitazone 30 MG tablet Commonly known as: ACTOS Take 30 mg by mouth every evening.   Safety Seal Miscellaneous Misc Please fill for Lantus  Solostar pen needles 1 box   tamsulosin  0.4 MG Caps capsule Commonly known as: FLOMAX  Take 0.4 mg by mouth 2 (two) times daily. Pt takes 2 tablets daily.   triamcinolone cream 0.1 % Commonly known as: KENALOG Apply to affected area twice a day as needed for rash               Durable Medical Equipment  (From admission, onward)           Start     Ordered   03/02/24 1016  For home use only DME Walker rolling  Once       Question Answer Comment  Walker: With 5 Inch Wheels   Patient needs a walker to treat with the following condition Unsteady gait      03/02/24 1015            Follow-up Information     Jace Martinet, FNP Follow up in 1 week(s).   Specialty: Family Medicine Contact information: [address] [place] [address] [phone]         [name], [employer] Follow up.   Why: rolling walker Contact information: [address] [address] [phone]                Allergies  Allergen Reactions   Codeine Nausea And Vomiting and Other (See Comments)    Consultations: None   Procedures/Studies: CT ABDOMEN PELVIS W CONTRAST Result Date: 02/27/2024 CLINICAL DATA:  Fatigue urinary frequency EXAM: CT ABDOMEN AND PELVIS WITH CONTRAST TECHNIQUE: Multidetector CT imaging of the abdomen and pelvis was performed using the standard protocol following bolus administration of intravenous contrast. RADIATION DOSE REDUCTION: This exam was performed according to the departmental dose-optimization program which includes automated exposure control, adjustment of the mA and/or kV according  to patient size and/or use of iterative reconstruction technique. CONTRAST:  OMNIPAQUE  IOHEXOL  300 MG/ML  SOLN COMPARISON:  CT 09/29/2013 FINDINGS: Lower chest: Lung bases demonstrate no acute airspace disease. Hepatobiliary: Hepatic steatosis. No calcified gallstone or biliary dilatation Pancreas: Unremarkable. No pancreatic ductal dilatation or surrounding inflammatory changes. Spleen: Normal in size without focal abnormality. Adrenals/Urinary Tract: Adrenal glands are normal. Kidneys show no hydronephrosis. Thick-walled urinary bladder with mucosal enhancement and mild perivesical stranding Stomach/Bowel: Stomach nonenlarged. No dilated small bowel. Negative appendix. No acute bowel wall thickening Vascular/Lymphatic: Aortic atherosclerosis. No enlarged abdominal or pelvic lymph nodes. Reproductive: Multiple prostate seeds Other: Negative for pelvic effusion or free air Musculoskeletal: No acute or suspicious osseous abnormality. Multilevel degenerative changes IMPRESSION: 1. Thick-walled urinary bladder with mucosal enhancement and mild perivesical  stranding, findings suggestive of cystitis. 2. Hepatic steatosis. 3. Aortic atherosclerosis. Aortic Atherosclerosis (ICD10-I70.0). Electronically Signed   By: Esmeralda Hedge M.D.   On: 02/27/2024 18:37      Subjective: Seen and examined at bedside.  Overnight events noted.   Patient reports doing much better and wants to be discharged.  Discharge Exam: Vitals:   03/01/24 1926 03/02/24 0457  BP: 137/71 132/61  Pulse: 80 71  Resp: 15 15  Temp: 97.9 F (36.6 C) 97.7 F (36.5 C)  SpO2: 97% 90%   Vitals:   03/01/24 0407 03/01/24 1135 03/01/24 1926 03/02/24 0457  BP: 137/69 (!) 155/74 137/71 132/61  Pulse: 82 81 80 71  Resp: 18  15 15   Temp: 98.7 F (37.1 C) 98.7 F (37.1 C) 97.9 F (36.6 C) 97.7 F (36.5 C)  TempSrc: Oral Oral    SpO2: 94% 96% 97% 90%  Weight:      Height:        General: Pt is alert, awake, not in acute distress Cardiovascular: RRR, S1/S2 +, no rubs, no gallops Respiratory: CTA bilaterally, no wheezing, no rhonchi Abdominal: Soft, NT, ND, bowel sounds + Extremities: no edema, no cyanosis    The results of significant diagnostics from this hospitalization (including imaging, microbiology, ancillary and laboratory) are listed below for reference.     Microbiology: Recent Results (from the past 240 hours)  Urine Culture     Status: Abnormal   Collection Time: 02/27/24  1:31 PM   Specimen: Urine, Clean Catch  Result Value Ref Range Status   Specimen Description   Final    URINE, CLEAN CATCH Performed at [facility], [address], [place], [place] [address]    Special Requests   Final    NONE Performed at [facility], [address] [place], [place] [address]    Culture >=100,000 COLONIES/mL PROTEUS MIRABILIS (A)  Final   Report Status 02/29/2024 FINAL  Final   Organism ID, Bacteria PROTEUS MIRABILIS (A)  Final      Susceptibility   Proteus mirabilis - MIC*    AMPICILLIN <=2 SENSITIVE Sensitive      CEFAZOLIN  <=4 SENSITIVE Sensitive     CEFEPIME <=0.12 SENSITIVE Sensitive     CEFTRIAXONE  <=0.25 SENSITIVE Sensitive     CIPROFLOXACIN  <=0.25 SENSITIVE Sensitive     GENTAMICIN <=1 SENSITIVE Sensitive     IMIPENEM 2 SENSITIVE Sensitive     NITROFURANTOIN 128 RESISTANT Resistant     TRIMETH/SULFA <=20 SENSITIVE Sensitive     AMPICILLIN/SULBACTAM <=2 SENSITIVE Sensitive     PIP/TAZO <=4 SENSITIVE Sensitive ug/mL    * >=100,000 COLONIES/mL PROTEUS MIRABILIS  Culture, blood (routine x 2)  Status: None (Preliminary result)   Collection Time: 02/27/24  2:34 PM   Specimen: BLOOD  Result Value Ref Range Status   Specimen Description   Final    BLOOD RIGHT ANTECUBITAL Performed at [facility], [address], [place], [place] [address]    Special Requests   Final    BOTTLES DRAWN AEROBIC ONLY Blood Culture results may not be optimal due to an inadequate volume of blood received in culture bottles Performed at [facility], [address], [place], [place] [address]    Culture   Final    NO GROWTH 4 DAYS Performed at [facility], [address], [place], [place] [address]    Report Status PENDING  Incomplete  Culture, blood (routine x 2)     Status: None (Preliminary result)   Collection Time: 02/27/24  3:24 PM   Specimen: BLOOD  Result Value Ref Range Status   Specimen Description   Final    BLOOD SITE NOT SPECIFIED Performed at [name], [address], [place], [place] [address]    Special Requests   Final    BOTTLES DRAWN AEROBIC AND ANAEROBIC Blood Culture results may not be optimal due to an inadequate volume of blood received in culture bottles Performed at [facility], [address] [place], [place] [address]    Culture   Final    NO GROWTH 4 DAYS Performed at [name], [address] [place], [place] [address]    Report Status PENDING  Incomplete     Labs: BNP (last  3 results) No results for input(s): "BNP" in the last 8760 hours. Basic Metabolic Panel: Recent Labs  Lab 02/27/24 1434 02/28/24 0503  NA 134* 132*  K 3.7 3.6  CL 100 98  CO2 25 24  GLUCOSE 247* 196*  BUN 13 11  CREATININE 1.00 0.93  CALCIUM  8.6* 8.7*   Liver Function Tests: Recent Labs  Lab 02/27/24 2250  AST 26  ALT 16  ALKPHOS 66  BILITOT 0.8  PROT 6.8  ALBUMIN 3.0*   Recent Labs  Lab 02/27/24 2250 03/02/24 0739  LIPASE 361* 96*   No results for input(s): "AMMONIA" in the last 168 hours. CBC: Recent Labs  Lab 02/27/24 1434 02/28/24 0503  WBC 3.3* 5.2  HGB 11.2* 11.6*  HCT 36.6* 37.6*  MCV 88.6 87.2  PLT 323 292   Cardiac Enzymes: No results for input(s): "CKTOTAL", "CKMB", "CKMBINDEX", "TROPONINI" in the last 168 hours. BNP: Invalid input(s): "POCBNP" CBG: Recent Labs  Lab 03/01/24 0748 03/01/24 1128 03/01/24 1612 03/01/24 2017 03/02/24 0757  GLUCAP 159* 181* 249* 253* 227*   D-Dimer No results for input(s): "DDIMER" in the last 72 hours. Hgb A1c No results for input(s): "HGBA1C" in the last 72 hours. Lipid Profile No results for input(s): "CHOL", "HDL", "LDLCALC", "TRIG", "CHOLHDL", "LDLDIRECT" in the last 72 hours. Thyroid  function studies No results for input(s): "TSH", "T4TOTAL", "T3FREE", "THYROIDAB" in the last 72 hours.  Invalid input(s): "FREET3" Anemia work up No results for input(s): "VITAMINB12", "FOLATE", "FERRITIN", "TIBC", "IRON", "RETICCTPCT" in the last 72 hours. Urinalysis    Component Value Date/Time   COLORURINE AMBER (A) 02/27/2024 1313   APPEARANCEUR TURBID (A) 02/27/2024 1313   LABSPEC 1.016 02/27/2024 1313   PHURINE 8.0 02/27/2024 1313   GLUCOSEU 150 (A) 02/27/2024 1313   HGBUR SMALL (A) 02/27/2024 1313   BILIRUBINUR NEGATIVE 02/27/2024 1313   KETONESUR 5 (A) 02/27/2024 1313  PROTEINUR >=300 (A) 02/27/2024 1313   NITRITE POSITIVE (A) 02/27/2024 1313   LEUKOCYTESUR LARGE (A) 02/27/2024 1313   Sepsis  Labs Recent Labs  Lab 02/27/24 1434 02/28/24 0503  WBC 3.3* 5.2   Microbiology Recent Results (from the past 240 hours)  Urine Culture     Status: Abnormal   Collection Time: 02/27/24  1:31 PM   Specimen: Urine, Clean Catch  Result Value Ref Range Status   Specimen Description   Final    URINE, CLEAN CATCH Performed at [facility], [address], [place], [place] [address]    Special Requests   Final    NONE Performed at [name], [address], [place], [place] [address]    Culture >=100,000 COLONIES/mL PROTEUS MIRABILIS (A)  Final   Report Status 02/29/2024 FINAL  Final   Organism ID, Bacteria PROTEUS MIRABILIS (A)  Final      Susceptibility   Proteus mirabilis - MIC*    AMPICILLIN <=2 SENSITIVE Sensitive     CEFAZOLIN  <=4 SENSITIVE Sensitive     CEFEPIME <=0.12 SENSITIVE Sensitive     CEFTRIAXONE  <=0.25 SENSITIVE Sensitive     CIPROFLOXACIN  <=0.25 SENSITIVE Sensitive     GENTAMICIN <=1 SENSITIVE Sensitive     IMIPENEM 2 SENSITIVE Sensitive     NITROFURANTOIN 128 RESISTANT Resistant     TRIMETH/SULFA <=20 SENSITIVE Sensitive     AMPICILLIN/SULBACTAM <=2 SENSITIVE Sensitive     PIP/TAZO <=4 SENSITIVE Sensitive ug/mL    * >=100,000 COLONIES/mL PROTEUS MIRABILIS  Culture, blood (routine x 2)     Status: None (Preliminary result)   Collection Time: 02/27/24  2:34 PM   Specimen: BLOOD  Result Value Ref Range Status   Specimen Description   Final    BLOOD RIGHT ANTECUBITAL Performed at [facility], [address] [place], [place] [address]    Special Requests   Final    BOTTLES DRAWN AEROBIC ONLY Blood Culture results may not be optimal due to an inadequate volume of blood received in culture bottles Performed at [facility], [address], [place], [place] [address]    Culture   Final    NO GROWTH 4 DAYS Performed at [facility], [address] [place], [place] [address]     Report Status PENDING  Incomplete  Culture, blood (routine x 2)     Status: None (Preliminary result)   Collection Time: 02/27/24  3:24 PM   Specimen: BLOOD  Result Value Ref Range Status   Specimen Description   Final    BLOOD SITE NOT SPECIFIED Performed at [facility], [address], [place], [place] [address]    Special Requests   Final    BOTTLES DRAWN AEROBIC AND ANAEROBIC Blood Culture results may not be optimal due to an inadequate volume of blood received in culture bottles Performed at [facility], [address], [place], [place] [address]    Culture   Final    NO GROWTH 4 DAYS Performed at [facility], [address] [place], [place] [address]    Report Status PENDING  Incomplete     Time coordinating discharge: Over 30 minutes  SIGNED:   Magdalene School, MD  [facility] 03/02/2024, 11:53 AM Pager   If 7PM-7AM, please contact night-coverage [email or web address] Password [email or web address]

## 2024-03-02 NOTE — Plan of Care (Signed)
  Problem: Clinical Measurements: Goal: Ability to maintain clinical measurements within normal limits will improve Outcome: Progressing Goal: Diagnostic test results will improve Outcome: Progressing Goal: Respiratory complications will improve Outcome: Progressing Goal: Cardiovascular complication will be avoided Outcome: Progressing   Problem: Elimination: Goal: Will not experience complications related to bowel motility Outcome: Progressing Goal: Will not experience complications related to urinary retention Outcome: Progressing   

## 2024-03-02 NOTE — Discharge Instructions (Signed)
 Advised to take cefadroxil  500 mg twice daily for Proteus UTI. Advised to drink more fluids.

## 2024-03-02 NOTE — TOC Transition Note (Signed)
 Transition of Care [facility]) - Discharge Note   Patient Details  Name: Alex Sanchez MRN: [id number] Date of Birth: July 20, 1946  Transition of [facility]) CM/SW Contact:  Ruben Corolla, RN Phone Number: 03/02/2024, 10:19 AM   Clinical Narrative: Ordered rw-no preference [employer] rep Zach to deliver rw to rm prior d/c. No further CM needs.      Final next level of care: Home/Self Care Barriers to Discharge: No Barriers Identified   Patient Goals and CMS Choice Patient states their goals for this hospitalization and ongoing recovery are:: Home CMS Medicare.gov Compare Post Acute Care list provided to:: Patient Represenative (must comment) (Mary(spouse)) Choice offered to / list presented to : Patient [place] ownership interest in [facility].provided to:: Patient    Discharge Placement                       Discharge Plan and Services Additional resources added to the After Visit Summary for     Discharge Planning Services: CM Consult            DME Arranged: Otho Blitz rolling DME Agency: [employer] Date DME Agency Contacted: 03/02/24 Time DME Agency Contacted: 1019 Representative spoke with at DME Agency: Gladys Lamp            Social Drivers of Health (SDOH) Interventions SDOH Screenings   Food Insecurity: Food Insecurity Present (02/28/2024)  Housing: High Risk (02/27/2024)  Transportation Needs: No Transportation Needs (02/27/2024)  Utilities: Not At Risk (02/27/2024)  Alcohol Screen: Low Risk  (06/05/2023)  Depression (PHQ2-9): Low Risk  (06/05/2023)  Social Connections: Unknown (02/28/2024)  Tobacco Use: Low Risk  (02/27/2024)     Readmission Risk Interventions     No data to display

## 2024-03-03 LAB — CULTURE, BLOOD (ROUTINE X 2)
Culture: NO GROWTH
Culture: NO GROWTH

## 2024-03-07 ENCOUNTER — Telehealth: Payer: Self-pay | Admitting: Radiation Oncology

## 2024-03-07 NOTE — Telephone Encounter (Signed)
 4/28 @ 8:53 am Patient's daughter called to get patient in for follow up appt with Dr. Lorri Rota.  Patient's daughter stated he was in the hospital and d/c on 4/23.  Secure chat sent to [employer] and copied Yves Herb, so they are aware.

## 2024-03-08 NOTE — Progress Notes (Signed)
 RN spoke with patient's daughter regarding request to have follow up with Dr. Lorri Rota.   Patient with history of stage T2a adenocarcinoma of the prostate with Gleason score of 3+4, and PSA of 5.8 and under went brachytherapy on 09/18/2023.   Patient recently admitted to hospital due to dysuria and discharged on 4/23.  Patient's daughter wanting to ensure appropriate follow up related to UTI.  RN educated that appropriate follow up would be with urology, Dr. Inga Manges.  Patient's daughter verbalized understanding.  RN provided contact number to [facility].  Patient's daughter verbalized she will call to set up follow up.

## 2024-03-22 ENCOUNTER — Other Ambulatory Visit (HOSPITAL_COMMUNITY)
Admission: RE | Admit: 2024-03-22 | Discharge: 2024-03-22 | Disposition: A | Discharge status: Home / Self Care | Source: Ambulatory Visit | Attending: Family Medicine | Admitting: Family Medicine

## 2024-03-22 DIAGNOSIS — N401 Enlarged prostate with lower urinary tract symptoms: Secondary | ICD-10-CM | POA: Diagnosis present

## 2024-03-22 LAB — URINALYSIS, W/ REFLEX TO CULTURE (INFECTION SUSPECTED)
Bilirubin Urine: NEGATIVE
Glucose, UA: >=500 mg/dL — AB
Hgb urine dipstick: NEGATIVE
Ketones, ur: NEGATIVE mg/dL
Nitrite: POSITIVE — AB
Protein, ur: 30 mg/dL — AB
Specific Gravity, Urine: 1.016 (ref 1.005–1.030)
WBC, UA: >50 WBC/hpf (ref 0–5)
pH: 5 (ref 5.0–8.0)

## 2024-03-22 LAB — CBC WITH DIFFERENTIAL/PLATELET
Abs Immature Granulocytes: 0 10*3/uL (ref 0.00–0.07)
Basophils Absolute: 0 10*3/uL (ref 0.0–0.1)
Basophils Relative: 1 %
Eosinophils Absolute: 0.1 10*3/uL (ref 0.0–0.5)
Eosinophils Relative: 1 %
HCT: 37.7 % — ABNORMAL LOW (ref 39.0–52.0)
Hemoglobin: 11.9 g/dL — ABNORMAL LOW (ref 13.0–17.0)
Immature Granulocytes: 0 %
Lymphocytes Relative: 23 %
Lymphs Abs: 1 10*3/uL (ref 0.7–4.0)
MCH: 28.3 pg (ref 26.0–34.0)
MCHC: 31.6 g/dL (ref 30.0–36.0)
MCV: 89.5 fL (ref 80.0–100.0)
Monocytes Absolute: 0.3 10*3/uL (ref 0.1–1.0)
Monocytes Relative: 8 %
Neutro Abs: 3 10*3/uL (ref 1.7–7.7)
Neutrophils Relative %: 67 %
Platelets: 235 10*3/uL (ref 150–400)
RBC: 4.21 MIL/uL — ABNORMAL LOW (ref 4.22–5.81)
RDW: 16.3 % — ABNORMAL HIGH (ref 11.5–15.5)
WBC: 4.4 10*3/uL (ref 4.0–10.5)
nRBC: 0 % (ref 0.0–0.2)

## 2024-03-22 LAB — COMPREHENSIVE METABOLIC PANEL WITH GFR
ALT: 11 U/L (ref 0–44)
AST: 17 U/L (ref 15–41)
Albumin: 3.5 g/dL (ref 3.5–5.0)
Alkaline Phosphatase: 60 U/L (ref 38–126)
Anion gap: 7 (ref 5–15)
BUN: 10 mg/dL (ref 8–23)
CO2: 29 mmol/L (ref 22–32)
Calcium: 8.9 mg/dL (ref 8.9–10.3)
Chloride: 100 mmol/L (ref 98–111)
Creatinine, Ser: 0.81 mg/dL (ref 0.61–1.24)
GFR, Estimated: >60 mL/min (ref >60)
Glucose, Bld: 298 mg/dL — ABNORMAL HIGH (ref 70–99)
Potassium: 4.2 mmol/L (ref 3.5–5.1)
Sodium: 136 mmol/L (ref 135–145)
Total Bilirubin: 0.4 mg/dL (ref 0.0–1.2)
Total Protein: 7 g/dL (ref 6.5–8.1)

## 2024-03-24 LAB — URINE CULTURE: Culture: >=100000 — AB

## 2024-04-02 LAB — AMB RESULTS CONSOLE CBG: Glucose: 213

## 2024-07-01 NOTE — Progress Notes (Signed)
 Pt attended 04/02/2024 screening event with BP of 124/78 and blood sugar was 213, pt is diabetic. Pt noted at event that he does have a PCP. At event pt did indicate housing & transportation SDOH needs. Pt also noted that he is not a smoker and listed Medicare as his insurance at the event.   Per initial f/u pt was reached out via phone (8/22; 8/26) and was unable to leave vm. Pt was mailed letter with housing resources, transportation resources, and [place] [address] card.  Per chart review pt does have a PCP (Tenika McCorkle; [facility]) , insurance, and is not a smoker. Pt's last appt with PCP was 03/22/2024. Pt does not indicate any SDOH needs at this time.  Additional pt f/u to be scheduled at this time per health equity protocol.

## 2024-08-25 NOTE — Progress Notes (Addendum)
 Pt attended 04/02/2024 screening event where his bp was 124/78 and his blood sugar was 213. Pt documented that he does have a PCP and insurance through Medicare. Pt also documented having SDOH housing and transportation needs.  Chart review indicated that pt has a PCP listed as Alex Sanchez,Alex Sanchez and is insured through [employer]. Pt has an up and coming appt on 09/21/2024 with Alex Sanchez,Alex Sanchez at [facility].  This CHW called pt to follow up on SDOH needs, someone picked up the phone and hung up, CHW called back again and was hung up on for a second time. Letter sent to pt with transportation resources, housing resource, blood sugar  educational material  and [phone] card.  *Pt returned CHW phone call, pt stated that he is living in an apartment with his wife and does not need any housing resources. Pt also stated that he and his wife have a car and are not in need of transportation resources. Pt was informed that his PCP Alex Sanchez moved to an office in [facility] and he did not want to continue care with her. Pt is seeing Dr. Kalombo on 09/21/24 to establish care.    No follow up to be scheduled per HE protocol.

## 2024-10-17 ENCOUNTER — Other Ambulatory Visit: Payer: Self-pay

## 2024-10-17 ENCOUNTER — Emergency Department (HOSPITAL_COMMUNITY)
Admission: EM | Admit: 2024-10-17 | Discharge: 2024-10-17 | Disposition: A | Discharge status: Home / Self Care | Attending: Emergency Medicine | Admitting: Emergency Medicine

## 2024-10-17 DIAGNOSIS — N390 Urinary tract infection, site not specified: Secondary | ICD-10-CM

## 2024-10-17 LAB — URINALYSIS, ROUTINE W REFLEX MICROSCOPIC
Bilirubin Urine: NEGATIVE
Glucose, UA: NEGATIVE mg/dL
Ketones, ur: NEGATIVE mg/dL
Nitrite: NEGATIVE
Protein, ur: NEGATIVE mg/dL
Specific Gravity, Urine: 1.008 (ref 1.005–1.030)
WBC, UA: >50 WBC/hpf (ref 0–5)
pH: 5 (ref 5.0–8.0)

## 2024-10-17 LAB — CBC WITH DIFFERENTIAL/PLATELET
Abs Immature Granulocytes: 0.02 K/uL (ref 0.00–0.07)
Basophils Absolute: 0 K/uL (ref 0.0–0.1)
Basophils Relative: 1 %
Eosinophils Absolute: 0.1 K/uL (ref 0.0–0.5)
Eosinophils Relative: 3 %
HCT: 38.3 % — ABNORMAL LOW (ref 39.0–52.0)
Hemoglobin: 11.7 g/dL — ABNORMAL LOW (ref 13.0–17.0)
Immature Granulocytes: 1 %
Lymphocytes Relative: 16 %
Lymphs Abs: 0.6 K/uL — ABNORMAL LOW (ref 0.7–4.0)
MCH: 27.7 pg (ref 26.0–34.0)
MCHC: 30.5 g/dL (ref 30.0–36.0)
MCV: 90.5 fL (ref 80.0–100.0)
Monocytes Absolute: 0.3 K/uL (ref 0.1–1.0)
Monocytes Relative: 8 %
Neutro Abs: 2.7 K/uL (ref 1.7–7.7)
Neutrophils Relative %: 71 %
Platelets: 241 K/uL (ref 150–400)
RBC: 4.23 MIL/uL (ref 4.22–5.81)
RDW: 14.4 % (ref 11.5–15.5)
WBC: 3.7 K/uL — ABNORMAL LOW (ref 4.0–10.5)
nRBC: 0 % (ref 0.0–0.2)

## 2024-10-17 LAB — BASIC METABOLIC PANEL WITH GFR
Anion gap: 10 (ref 5–15)
BUN: 15 mg/dL (ref 8–23)
CO2: 27 mmol/L (ref 22–32)
Calcium: 9.7 mg/dL (ref 8.9–10.3)
Chloride: 101 mmol/L (ref 98–111)
Creatinine, Ser: 1.13 mg/dL (ref 0.61–1.24)
GFR, Estimated: >60 mL/min (ref >60)
Glucose, Bld: 220 mg/dL — ABNORMAL HIGH (ref 70–99)
Potassium: 4.8 mmol/L (ref 3.5–5.1)
Sodium: 137 mmol/L (ref 135–145)

## 2024-10-17 NOTE — ED Triage Notes (Signed)
 Pt arrives to triage via wheelchair with complaints of hematuria. Pt states that he was dx with a UTI recently, and is currently taking ABX. Pt is concerned because the hematuria just began.

## 2024-10-17 NOTE — ED Provider Notes (Signed)
 [place] [facility]MENT AT [facility] Provider Note   CSN: [id number] Arrival date & time: 10/17/24  9062     Patient presents with: Hematuria   Alex Sanchez is a 78 y.o. male.    Hematuria  Patient presents with hematuria.  3 days ago had been seen by his urologist.  Reports he has prostate cancer.  States his urologist that he had an infection and started on 14 days of what sounds like Bactrim.  States now has a little bit of blood in the urine.  It sounds as if the urine was mostly red but did not necessarily see clots.  Also felt a little lightheaded today.  No other bleeding.  Not on blood thinners.     Prior to Admission medications   Medication Sig Start Date End Date Taking? Authorizing Provider  ACCU-CHEK AVIVA PLUS test strip 1 each 2 (two) times daily. 10/12/21   [provider]  Accu-Chek Softclix Lancets lancets USE TO CHECK BLOOD SUGAR TWICE DAILY 10/05/21   [provider]  aspirin  81 MG chewable tablet Chew 81 mg by mouth daily.    [provider]  atorvastatin  (LIPITOR) 20 MG tablet Take 20 mg by mouth every evening.    [provider]  B-D ULTRAFINE III SHORT PEN 31G X 8 MM MISC Inject 1 each into the skin 3 (three) times daily. 12/31/21   [provider]  cholecalciferol (VITAMIN D3) 25 MCG (1000 UNIT) tablet Take 1,000 Units by mouth daily.    [provider]  dorzolamide-timolol (COSOPT) 2-0.5 % ophthalmic solution Place 1 drop into both eyes 2 (two) times daily. 11/22/22   [provider]  finasteride  (PROSCAR ) 5 MG tablet Take 1 tablet by mouth daily.    [provider]  fluticasone (FLONASE) 50 MCG/ACT nasal spray Place 2 sprays into both nostrils daily. 11/12/21   [provider]  glipiZIDE (GLUCOTROL XL) 5 MG 24 hr tablet Take 10 mg by mouth 2 (two) times daily.  Takes 2 of 10 mg bid    [provider]  insulin  glargine (LANTUS ) 100 UNIT/ML Solostar Pen  Inject 10 units of insulin  in the morning and 5 units before bedtime Patient taking differently: Inject 28 units of insulin  in the morning and 9 units before bedtime 02/05/23   Jamison Browning A, DO  latanoprost (XALATAN) 0.005 % ophthalmic solution 1 drop at bedtime. 12/14/21   [provider]  losartan  (COZAAR ) 25 MG tablet Take 25 mg by mouth daily. 12/10/21   [provider]  metFORMIN (GLUCOPHAGE) 1000 MG tablet Take 1,000 mg by mouth 2 (two) times daily. 12/05/21   [provider]  montelukast (SINGULAIR) 10 MG tablet Take 10 mg by mouth daily. Patient not taking: Reported on 02/27/2024 12/31/21   [provider]  Multiple Vitamins-Minerals (MULTIVITAMIN WITH MINERALS) tablet Take 1 tablet by mouth daily.    [provider]  pioglitazone (ACTOS) 30 MG tablet Take 30 mg by mouth every evening.    [provider]  Safety Seal Miscellaneous MISC Please fill for Lantus  Solostar pen needles 1 box 02/05/23   Jamison Browning A, DO  tamsulosin  (FLOMAX ) 0.4 MG CAPS capsule Take 0.4 mg by mouth 2 (two) times daily. Pt takes 2 tablets daily. 12/11/21   [provider]  triamcinolone cream (KENALOG) 0.1 % Apply to affected area twice a day as needed for rash Patient not taking: Reported on 08/05/2023    [provider]  Allergies: Codeine    Review of Systems  Genitourinary:  Positive for hematuria.    Updated Vital Signs BP (!) 141/71 (BP Location: Left Arm)   Pulse 90   Temp 98.7 F (37.1 C) (Oral)   Resp 16   SpO2 98%   Physical Exam Vitals and nursing note reviewed.  Cardiovascular:     Rate and Rhythm: Normal rate.  Abdominal:     Tenderness: There is no abdominal tenderness.  Neurological:     Mental Status: He is alert.     (all labs ordered are listed, but only abnormal results are displayed) Labs Reviewed  URINALYSIS, ROUTINE W REFLEX MICROSCOPIC - Abnormal; Notable for the following components:       Result Value   Color, Urine STRAW (*)    APPearance HAZY (*)    Hgb urine dipstick MODERATE (*)    Leukocytes,Ua LARGE (*)    Bacteria, UA RARE (*)    All other components within normal limits  BASIC METABOLIC PANEL WITH GFR - Abnormal; Notable for the following components:   Glucose, Bld 220 (*)    All other components within normal limits  CBC WITH DIFFERENTIAL/PLATELET - Abnormal; Notable for the following components:   WBC 3.7 (*)    Hemoglobin 11.7 (*)    HCT 38.3 (*)    Lymphs Abs 0.6 (*)    All other components within normal limits  URINE CULTURE    EKG: None  Radiology: No results found.   Procedures   Medications Ordered in the ED - No data to display                                  Medical Decision Making Amount and/or Complexity of Data Reviewed Labs: ordered.   Patient hematuria.  Currently being treated for UTI.  Differential diagnosis does include hematuria from the UTI but also obstruction considered.  Will get urinalysis.  Will get basic blood work including CBC and kidney function.  Will also check postvoid residual.  Blood work reassuring.  Hemoglobin reassuring.  Good kidney function.  Postvoid residual was 0.  Urine does show likely infection and some hematuria.  I think at this point we have not failed Bactrim.  Will continue with cultures been sent.  Follow-up with urology as needed return for worsening symptoms.       Final diagnoses:  Lower urinary tract infectious disease    ED Discharge Orders     None          Patsey Lot, MD 10/17/24 1241

## 2024-10-17 NOTE — Discharge Instructions (Signed)
 Looks like the bleeding is likely coming from an infection.  Continue the antibiotics that you are on.  A culture has been sent and if you are resistant to the antibiotic you will hear from us .  Return for worsening symptoms.  return for worsening lightheadedness dizziness or fevers.  Follow-up with your urologist.

## 2024-10-17 NOTE — ED Notes (Signed)
 Measure post void residual = 0 mL Provider notified

## 2024-10-18 LAB — URINE CULTURE: Culture: NO GROWTH
